# Patient Record
Sex: Female | Born: 1950 | Race: White | Hispanic: No | Marital: Married | Smoking: Never smoker
Health system: Southern US, Community
[De-identification: ages and names within clinical notes are randomized; demographics above are authoritative.]

## PROBLEM LIST (undated history)

## (undated) DIAGNOSIS — G43909 Migraine, unspecified, not intractable, without status migrainosus: Secondary | ICD-10-CM

## (undated) DIAGNOSIS — M1990 Unspecified osteoarthritis, unspecified site: Secondary | ICD-10-CM

## (undated) DIAGNOSIS — Z8619 Personal history of other infectious and parasitic diseases: Secondary | ICD-10-CM

## (undated) DIAGNOSIS — M810 Age-related osteoporosis without current pathological fracture: Secondary | ICD-10-CM

## (undated) DIAGNOSIS — U071 COVID-19: Secondary | ICD-10-CM

## (undated) DIAGNOSIS — H9190 Unspecified hearing loss, unspecified ear: Secondary | ICD-10-CM

## (undated) DIAGNOSIS — E559 Vitamin D deficiency, unspecified: Secondary | ICD-10-CM

## (undated) DIAGNOSIS — R42 Dizziness and giddiness: Secondary | ICD-10-CM

## (undated) DIAGNOSIS — N390 Urinary tract infection, site not specified: Secondary | ICD-10-CM

## (undated) HISTORY — DX: Unspecified hearing loss, unspecified ear: H91.90

## (undated) HISTORY — DX: Unspecified osteoarthritis, unspecified site: M19.90

## (undated) HISTORY — DX: Personal history of other infectious and parasitic diseases: Z86.19

## (undated) HISTORY — DX: COVID-19: U07.1

## (undated) HISTORY — DX: Migraine, unspecified, not intractable, without status migrainosus: G43.909

## (undated) HISTORY — PX: OTHER SURGICAL HISTORY: SHX169

## (undated) HISTORY — DX: Vitamin D deficiency, unspecified: E55.9

## (undated) HISTORY — DX: Age-related osteoporosis without current pathological fracture: M81.0

## (undated) HISTORY — DX: Urinary tract infection, site not specified: N39.0

## (undated) HISTORY — PX: BREAST BIOPSY: SHX20

---
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
## 2002-06-28 DIAGNOSIS — D170 Benign lipomatous neoplasm of skin and subcutaneous tissue of head, face and neck: Secondary | ICD-10-CM | POA: Insufficient documentation

## 2003-04-10 DIAGNOSIS — M778 Other enthesopathies, not elsewhere classified: Secondary | ICD-10-CM | POA: Insufficient documentation

## 2003-07-03 DIAGNOSIS — M159 Polyosteoarthritis, unspecified: Secondary | ICD-10-CM | POA: Insufficient documentation

## 2003-09-12 DIAGNOSIS — M6740 Ganglion, unspecified site: Secondary | ICD-10-CM | POA: Insufficient documentation

## 2004-11-18 DIAGNOSIS — R6889 Other general symptoms and signs: Secondary | ICD-10-CM | POA: Insufficient documentation

## 2006-09-21 DIAGNOSIS — R55 Syncope and collapse: Secondary | ICD-10-CM | POA: Insufficient documentation

## 2006-12-25 DIAGNOSIS — H8309 Labyrinthitis, unspecified ear: Secondary | ICD-10-CM | POA: Insufficient documentation

## 2017-04-09 DIAGNOSIS — H43393 Other vitreous opacities, bilateral: Secondary | ICD-10-CM | POA: Diagnosis not present

## 2017-04-09 DIAGNOSIS — H04123 Dry eye syndrome of bilateral lacrimal glands: Secondary | ICD-10-CM | POA: Diagnosis not present

## 2017-04-09 DIAGNOSIS — H25091 Other age-related incipient cataract, right eye: Secondary | ICD-10-CM | POA: Diagnosis not present

## 2017-04-09 DIAGNOSIS — H2512 Age-related nuclear cataract, left eye: Secondary | ICD-10-CM | POA: Diagnosis not present

## 2017-06-01 DIAGNOSIS — J069 Acute upper respiratory infection, unspecified: Secondary | ICD-10-CM | POA: Diagnosis not present

## 2017-06-10 DIAGNOSIS — M2022 Hallux rigidus, left foot: Secondary | ICD-10-CM | POA: Diagnosis not present

## 2017-06-10 DIAGNOSIS — M150 Primary generalized (osteo)arthritis: Secondary | ICD-10-CM | POA: Diagnosis not present

## 2017-06-10 DIAGNOSIS — M659 Synovitis and tenosynovitis, unspecified: Secondary | ICD-10-CM | POA: Diagnosis not present

## 2017-10-21 ENCOUNTER — Ambulatory Visit (INDEPENDENT_AMBULATORY_CARE_PROVIDER_SITE_OTHER): Payer: MEDICARE | Admitting: Internal Medicine

## 2017-10-21 ENCOUNTER — Encounter: Payer: Self-pay | Admitting: Internal Medicine

## 2017-10-21 ENCOUNTER — Encounter (INDEPENDENT_AMBULATORY_CARE_PROVIDER_SITE_OTHER): Payer: Self-pay

## 2017-10-21 VITALS — BP 132/78 | HR 68 | Temp 98.1°F | Ht 60.75 in | Wt 130.1 lb

## 2017-10-21 DIAGNOSIS — M1990 Unspecified osteoarthritis, unspecified site: Secondary | ICD-10-CM | POA: Diagnosis not present

## 2017-10-21 DIAGNOSIS — Z0000 Encounter for general adult medical examination without abnormal findings: Secondary | ICD-10-CM | POA: Diagnosis not present

## 2017-10-21 DIAGNOSIS — L989 Disorder of the skin and subcutaneous tissue, unspecified: Secondary | ICD-10-CM

## 2017-10-21 DIAGNOSIS — Z1159 Encounter for screening for other viral diseases: Secondary | ICD-10-CM

## 2017-10-21 DIAGNOSIS — H9192 Unspecified hearing loss, left ear: Secondary | ICD-10-CM

## 2017-10-21 DIAGNOSIS — Z0184 Encounter for antibody response examination: Secondary | ICD-10-CM | POA: Diagnosis not present

## 2017-10-21 DIAGNOSIS — Z23 Encounter for immunization: Secondary | ICD-10-CM | POA: Diagnosis not present

## 2017-10-21 DIAGNOSIS — Z13220 Encounter for screening for lipoid disorders: Secondary | ICD-10-CM

## 2017-10-21 DIAGNOSIS — H905 Unspecified sensorineural hearing loss: Secondary | ICD-10-CM | POA: Insufficient documentation

## 2017-10-21 DIAGNOSIS — E559 Vitamin D deficiency, unspecified: Secondary | ICD-10-CM

## 2017-10-21 DIAGNOSIS — Z1389 Encounter for screening for other disorder: Secondary | ICD-10-CM

## 2017-10-21 DIAGNOSIS — Z8739 Personal history of other diseases of the musculoskeletal system and connective tissue: Secondary | ICD-10-CM

## 2017-10-21 DIAGNOSIS — Z1329 Encounter for screening for other suspected endocrine disorder: Secondary | ICD-10-CM

## 2017-10-21 DIAGNOSIS — Z1283 Encounter for screening for malignant neoplasm of skin: Secondary | ICD-10-CM

## 2017-10-21 DIAGNOSIS — Z8669 Personal history of other diseases of the nervous system and sense organs: Secondary | ICD-10-CM | POA: Diagnosis not present

## 2017-10-21 DIAGNOSIS — L821 Other seborrheic keratosis: Secondary | ICD-10-CM | POA: Insufficient documentation

## 2017-10-21 NOTE — Patient Instructions (Addendum)
Magnesium 400 mg can help with migraine  Strontrium 500 mg daily  Prolia injection Rx every 6 months  Referred to Dr. Kellie Moor dermatology [facility]   Seborrheic Keratosis Seborrheic keratosis is a common, noncancerous (benign) skin growth. This condition causes waxy, rough, tan, brown, or black spots to appear on the skin. These skin growths can be flat or raised. What are the causes? The cause of this condition is not known. What increases the risk? This condition is more likely to develop in:  People who have a family history of seborrheic keratosis.  People who are 67 or older.  People who are pregnant.  People who have had estrogen replacement therapy.  What are the signs or symptoms? This condition often occurs on the face, chest, shoulders, back, or other areas. These growths:  Are usually painless, but may become irritated and itchy.  Can be yellow, brown, black, or other colors.  Are slightly raised or have a flat surface.  Are sometimes rough or wart-like in texture.  Are often waxy on the surface.  Are round or oval-shaped.  Sometimes look like they are "stuck on."  Often occur in groups, but may occur as a single growth.  How is this diagnosed? This condition is diagnosed with a medical history and physical exam. A sample of the growth may be tested (skin biopsy). You may need to see a skin specialist (dermatologist). How is this treated? Treatment is not usually needed for this condition, unless the growths are irritated or are often bleeding. You may also choose to have the growths removed if you do not like their appearance. Most commonly, these growths are treated with a procedure in which liquid nitrogen is applied to "freeze" off the growth (cryosurgery). They may also be burned off with electricity or cut off. Follow these instructions at home:  Watch your growth for any changes.  Keep all follow-up visits as told by your health care provider. This is  important.  Do not scratch or pick at the growth or growths. This can cause them to become irritated or infected. Contact a health care provider if:  You suddenly have many new growths.  Your growth bleeds, itches, or hurts.  Your growth suddenly becomes larger or changes color. This information is not intended to replace advice given to you by your health care provider. Make sure you discuss any questions you have with your health care provider. Document Released: 01/25/2010 Document Revised: 05/31/2015 Document Reviewed: 05/10/2014 Elsevier Interactive Patient Education  2018 [employer].    Osteoporosis Osteoporosis is the thinning and loss of density in the bones. Osteoporosis makes the bones more brittle, fragile, and likely to break (fracture). Over time, osteoporosis can cause the bones to become so weak that they fracture after a simple fall. The bones most likely to fracture are the bones in the hip, wrist, and spine. What are the causes? The exact cause is not known. What increases the risk? Anyone can develop osteoporosis. You may be at greater risk if you have a family history of the condition or have poor nutrition. You may also have a higher risk if you are:  Female.  61 years old or older.  A smoker.  Not physically active.  White or Asian.  Slender.  What are the signs or symptoms? A fracture might be the first sign of the disease, especially if it results from a fall or injury that would not usually cause a bone to break. Other signs and  symptoms include:  Low back and neck pain.  Stooped posture.  Height loss.  How is this diagnosed? To make a diagnosis, your health care provider may:  Take a medical history.  Perform a physical exam.  Order tests, such as: ? A bone mineral density test. ? A dual-energy X-ray absorptiometry test.  How is this treated? The goal of osteoporosis treatment is to strengthen your bones to reduce your risk of a  fracture. Treatment may involve:  Making lifestyle changes, such as: ? Eating a diet rich in calcium. ? Doing weight-bearing and muscle-strengthening exercises. ? Stopping tobacco use. ? Limiting alcohol intake.  Taking medicine to slow the process of bone loss or to increase bone density.  Monitoring your levels of calcium and vitamin D.  Follow these instructions at home:  Include calcium and vitamin D in your diet. Calcium is important for bone health, and vitamin D helps the body absorb calcium.  Perform weight-bearing and muscle-strengthening exercises as directed by your health care provider.  Do not use any tobacco products, including cigarettes, chewing tobacco, and electronic cigarettes. If you need help quitting, ask your health care provider.  Limit your alcohol intake.  Take medicines only as directed by your health care provider.  Keep all follow-up visits as directed by your health care provider. This is important.  Take precautions at home to lower your risk of falling, such as: ? Keeping rooms well lit and clutter free. ? Installing safety rails on stairs. ? Using rubber mats in the bathroom and other areas that are often wet or slippery. Get help right away if: You fall or injure yourself. This information is not intended to replace advice given to you by your health care provider. Make sure you discuss any questions you have with your health care provider. Document Released: 10/02/2004 Document Revised: 05/28/2015 Document Reviewed: 06/02/2013 Elsevier Interactive Patient Education  2018 [employer].    shingrix vaccine/Recombinant Zoster (Shingles) Vaccine, RZV: What You Need to Know 1. Why get vaccinated? Shingles (also called herpes zoster, or just zoster) is a painful skin rash, often with blisters. Shingles is caused by the varicella zoster virus, the same virus that causes chickenpox. After you have chickenpox, the virus stays in your body and can  cause shingles later in life. You can't catch shingles from another person. However, a person who has never had chickenpox (or chickenpox vaccine) could get chickenpox from someone with shingles. A shingles rash usually appears on one side of the face or body and heals within 2 to 4 weeks. Its main symptom is pain, which can be severe. Other symptoms can include fever, headache, chills and upset stomach. Very rarely, a shingles infection can lead to pneumonia, hearing problems, blindness, brain inflammation (encephalitis), or death. For about 1 person in 5, severe pain can continue even long after the rash has cleared up. This long-lasting pain is called post-herpetic neuralgia (PHN). Shingles is far more common in people 61 years of age and older than in younger people, and the risk increases with age. It is also more common in people whose immune system is weakened because of a disease such as cancer, or by drugs such as steroids or chemotherapy. At least 1 million people a year in the [place] get shingles. 2. Shingles vaccine (recombinant) Recombinant shingles vaccine was approved by FDA in 2017 for the prevention of shingles. In clinical trials, it was more than 90% effective in preventing shingles. It can also reduce the likelihood  of PHN. Two doses, 2 to 6 months apart, are recommended for adults 28 and older. This vaccine is also recommended for people who have already gotten the live shingles vaccine (Zostavax). There is no live virus in this vaccine. 3. Some people should not get this vaccine Tell your vaccine provider if you:  Have any severe, life-threatening allergies. A person who has ever had a life-threatening allergic reaction after a dose of recombinant shingles vaccine, or has a severe allergy to any component of this vaccine, may be advised not to be vaccinated. Ask your health care provider if you want information about vaccine components.  Are pregnant or breastfeeding.  There is not much information about use of recombinant shingles vaccine in pregnant or nursing women. Your healthcare provider might recommend delaying vaccination.  Are not feeling well. If you have a mild illness, such as a cold, you can probably get the vaccine today. If you are moderately or severely ill, you should probably wait until you recover. Your doctor can advise you.  4. Risks of a vaccine reaction With any medicine, including vaccines, there is a chance of reactions. After recombinant shingles vaccination, a person might experience:  Pain, redness, soreness, or swelling at the site of the injection  Headache, muscle aches, fever, shivering, fatigue  In clinical trials, most people got a sore arm with mild or moderate pain after vaccination, and some also had redness and swelling where they got the shot. Some people felt tired, had muscle pain, a headache, shivering, fever, stomach pain, or nausea. About 1 out of 6 people who got recombinant zoster vaccine experienced side effects that prevented them from doing regular activities. Symptoms went away on their own in about 2 to 3 days. Side effects were more common in younger people. You should still get the second dose of recombinant zoster vaccine even if you had one of these reactions after the first dose. Other things that could happen after this vaccine:  People sometimes faint after medical procedures, including vaccination. Sitting or lying down for about 15 minutes can help prevent fainting and injuries caused by a fall. Tell your provider if you feel dizzy or have vision changes or ringing in the ears.  Some people get shoulder pain that can be more severe and longer-lasting than routine soreness that can follow injections. This happens very rarely.  Any medication can cause a severe allergic reaction. Such reactions to a vaccine are estimated at about 1 in a million doses, and would happen within a few minutes to a few hours  after the vaccination. As with any medicine, there is a very remote chance of a vaccine causing a serious injury or death. The safety of vaccines is always being monitored. For more information, visit: http://www.aguilar.org/ 5. What if there is a serious problem? What should I look for?  Look for anything that concerns you, such as signs of a severe allergic reaction, very high fever, or unusual behavior. Signs of a severe allergic reaction can include hives, swelling of the face and throat, difficulty breathing, a fast heartbeat, dizziness, and weakness. These would usually start a few minutes to a few hours after the vaccination. What should I do?  If you think it is a severe allergic reaction or other emergency that can't wait, call 9-1-1 and get to the nearest hospital. Otherwise, call your health care provider. Afterward, the reaction should be reported to the Vaccine Adverse Event Reporting System (VAERS). Your doctor should file this report,  or you can do it yourself through the VAERS web site atwww.vaers.https://www.bray.com/ by calling [phone]. VAERS does not give medical advice. 6. How can I learn more?  Ask your healthcare provider. He or she can give you the vaccine package insert or suggest other sources of information.  Call your local or state health department.  Contact the Centers for Disease Control and Prevention (CDC): ? Call 769-414-6370 (1-800-CDC-INFO) or ? Visit the CDC's website at http://hunter.com/ CDC Vaccine Information Statement (VIS) Recombinant Zoster Vaccine (02/18/2016) This information is not intended to replace advice given to you by your health care provider. Make sure you discuss any questions you have with your health care provider. Document Released: 03/04/2016 Document Revised: 03/04/2016 Document Reviewed: 03/04/2016 Elsevier Interactive Patient Education  2018 [employer].   Migraine Headache A migraine headache is an intense, throbbing  pain on one side or both sides of the head. Migraines may also cause other symptoms, such as nausea, vomiting, and sensitivity to light and noise. What are the causes? Doing or taking certain things may also trigger migraines, such as:  Alcohol.  Smoking.  Medicines, such as: ? Medicine used to treat chest pain (nitroglycerine). ? Birth control pills. ? Estrogen pills. ? Certain blood pressure medicines.  Aged cheeses, chocolate, or caffeine.  Foods or drinks that contain nitrates, glutamate, aspartame, or tyramine.  Physical activity.  Other things that may trigger a migraine include:  Menstruation.  Pregnancy.  Hunger.  Stress, lack of sleep, too much sleep, or fatigue.  Weather changes.  What increases the risk? The following factors may make you more likely to experience migraine headaches:  Age. Risk increases with age.  Family history of migraine headaches.  Being Caucasian.  Depression and anxiety.  Obesity.  Being a woman.  Having a hole in the heart (patent foramen ovale) or other heart problems.  What are the signs or symptoms? The main symptom of this condition is pulsating or throbbing pain. Pain may:  Happen in any area of the head, such as on one side or both sides.  Interfere with daily activities.  Get worse with physical activity.  Get worse with exposure to bright lights or loud noises.  Other symptoms may include:  Nausea.  Vomiting.  Dizziness.  General sensitivity to bright lights, loud noises, or smells.  Before you get a migraine, you may get warning signs that a migraine is developing (aura). An aura may include:  Seeing flashing lights or having blind spots.  Seeing bright spots, halos, or zigzag lines.  Having tunnel vision or blurred vision.  Having numbness or a tingling feeling.  Having trouble talking.  Having muscle weakness.  How is this diagnosed? A migraine headache can be diagnosed based  on:  Your symptoms.  A physical exam.  Tests, such as CT scan or MRI of the head. These imaging tests can help rule out other causes of headaches.  Taking fluid from the spine (lumbar puncture) and analyzing it (cerebrospinal fluid analysis, or CSF analysis).  How is this treated? A migraine headache is usually treated with medicines that:  Relieve pain.  Relieve nausea.  Prevent migraines from coming back.  Treatment may also include:  Acupuncture.  Lifestyle changes like avoiding foods that trigger migraines.  Follow these instructions at home: Medicines  Take over-the-counter and prescription medicines only as told by your health care provider.  Do not drive or use heavy machinery while taking prescription pain medicine.  To prevent or treat constipation while you  are taking prescription pain medicine, your health care provider may recommend that you: ? Drink enough fluid to keep your urine clear or pale yellow. ? Take over-the-counter or prescription medicines. ? Eat foods that are high in fiber, such as fresh fruits and vegetables, whole grains, and beans. ? Limit foods that are high in fat and processed sugars, such as fried and sweet foods. Lifestyle  Avoid alcohol use.  Do not use any products that contain nicotine or tobacco, such as cigarettes and e-cigarettes. If you need help quitting, ask your health care provider.  Get at least 8 hours of sleep every night.  Limit your stress. General instructions   Keep a journal to find out what may trigger your migraine headaches. For example, write down: ? What you eat and drink. ? How much sleep you get. ? Any change to your diet or medicines.  If you have a migraine: ? Avoid things that make your symptoms worse, such as bright lights. ? It may help to lie down in a dark, quiet room. ? Do not drive or use heavy machinery. ? Ask your health care provider what activities are safe for you while you are  experiencing symptoms.  Keep all follow-up visits as told by your health care provider. This is important. Contact a health care provider if:  You develop symptoms that are different or more severe than your usual migraine symptoms. Get help right away if:  Your migraine becomes severe.  You have a fever.  You have a stiff neck.  You have vision loss.  Your muscles feel weak or like you cannot control them.  You start to lose your balance often.  You develop trouble walking.  You faint. This information is not intended to replace advice given to you by your health care provider. Make sure you discuss any questions you have with your health care provider. Document Released: 12/23/2004 Document Revised: 07/13/2015 Document Reviewed: 06/11/2015 Elsevier Interactive Patient Education  2017 [employer].

## 2017-10-21 NOTE — Progress Notes (Addendum)
Chief Complaint  Patient presents with  . Establish Care  . Mass    R eyelid   . Referral    dermatology for wart   New patient moved from  1. H/o migraines tries Excedrine migraine-normally sees swigly lines/distorted vision unable to talk, left arm recently numb; senstive light and sound. FH daughter, uncles and brother with migraines had MRI/CT 09/27/16 normal per pt for another reasons.  2. H/o arthritis esp hands uses prn Voltaren gel  3. C/o SK left scalp and legs and need for tbse  4. Vit D def  5. Right eye red lesion ? Etiology bruise vs dermatitis  6. H/o osteoporosis Boniva caused jaw pain so she stopped will get copy DEXA from [place] 7. Left hearing loss since 09/27/16 CT/MRIs negative per pt and she saw ENTwears left hearing add    Review of Systems  HENT: Positive for hearing loss.        Left ear chronic hearing loss since 09/2016  Eyes: Negative for blurred vision.  Respiratory: Negative for shortness of breath.   Cardiovascular: Negative for chest pain.  Gastrointestinal: Negative for abdominal pain.  Genitourinary: Negative for dysuria.  Musculoskeletal: Positive for joint pain.  Skin:       Right eyelid lesion  Left scalp lesion   Neurological: Negative for headaches.  Psychiatric/Behavioral: Negative for depression.   Past Medical History:  Diagnosis Date  . Arthritis    hands  . Hearing loss    left ear 09/27/16 MR, CT negative etiology she had rash neck 2 days before hearing loss left ear so ? virus  . History of chicken pox   . Migraine    tries Excedrine migraine-normally sees swigly lines/distorted vision unable to talk, left arm recently numb; senstive light and sound  . Osteoporosis   . UTI (urinary tract infection)   . Vitamin D deficiency    Past Surgical History:  Procedure Laterality Date  . CESAREAN SECTION     1986  . hospitalization     2006 untx'ed UTI  . TONSILLECTOMY  1966   Family History  Problem Relation Age of Onset  . Lung  cancer Mother   . Early death Mother   . Cancer Mother        lung cancer smoker   . Heart attack Father   . Stroke Father   . Heart disease Father   . Arthritis Brother        rheumatoid  . Migraines Brother   . Migraines Daughter   . Diabetes Maternal Grandmother        died comp. DM 2   . Early death Maternal Grandmother   . Hypertension Maternal Grandmother   . Early death Paternal Grandfather   . Hearing loss Paternal Grandfather    Social History   Socioeconomic History  . Marital status: Married    Spouse name: Not on file  . Number of children: Not on file  . Years of education: Not on file  . Highest education level: Not on file  Occupational History  . Not on file  Social Needs  . Financial resource strain: Not on file  . Food insecurity:    Worry: Not on file    Inability: Not on file  . Transportation needs:    Medical: Not on file    Non-medical: Not on file  Tobacco Use  . Smoking status: Never Smoker  . Smokeless tobacco: Never Used  Substance and Sexual Activity  .  Alcohol use: Never    Frequency: Never  . Drug use: Never  . Sexual activity: Yes  Lifestyle  . Physical activity:    Days per week: Not on file    Minutes per session: Not on file  . Stress: Not on file  Relationships  . Social connections:    Talks on phone: Not on file    Gets together: Not on file    Attends religious service: Not on file    Active member of club or organization: Not on file    Attends meetings of clubs or organizations: Not on file    Relationship status: Not on file  . Intimate partner violence:    Fear of current or ex partner: Not on file    Emotionally abused: Not on file    Physically abused: Not on file    Forced sexual activity: Not on file  Other Topics Concern  . Not on file  Social History Narrative   Married [name]    1 kid    And 1 stepkid and 1 adopted kid    Retired Scientist, water quality    Owns guns, wears seat belt, safe in  relationship    Current Meds  Medication Sig  . diclofenac sodium (VOLTAREN) 1 % GEL Apply topically every 14 (fourteen) days.   No Known Allergies No results found for this or any previous visit (from the past 2160 hour(s)). Objective  Body mass index is 24.79 kg/m. Wt Readings from Last 3 Encounters:  10/21/17 130 lb 2 oz (59 kg)   Temp Readings from Last 3 Encounters:  10/21/17 98.1 F (36.7 C) (Oral)   BP Readings from Last 3 Encounters:  10/21/17 132/78   Pulse Readings from Last 3 Encounters:  10/21/17 68    Physical Exam  Constitutional: She is oriented to person, place, and time. Vital signs are normal. She appears well-developed and well-nourished. She is cooperative.  HENT:  Head: Normocephalic and atraumatic.  Mouth/Throat: Oropharynx is clear and moist and mucous membranes are normal.  Eyes: Pupils are equal, round, and reactive to light. Conjunctivae are normal.  Cardiovascular: Normal rate, regular rhythm and normal heart sounds.  Pulmonary/Chest: Effort normal and breath sounds normal.  Neurological: She is alert and oriented to person, place, and time. Gait normal.  Skin: Skin is warm and dry.     SK left scalp  Sks legs   Psychiatric: She has a normal mood and affect. Her speech is normal and behavior is normal. Judgment and thought content normal. Cognition and memory are normal.  Nursing note and vitals reviewed.   Assessment   1. H/o migraines  -see pmh details 2. H/o arthritis  3. C/o SK left scalp and legs and need for tbse  4. Vit D def  5. Right eye red lesion ? Etiology bruise vs dermatitis  6. H/o osteoporosis Boniva caused jaw pain 7. Left hearing loss since 09/27/16 CT/MRIs negative per pt and she saw ENTwears left hearing aid  8. HM Plan   1. Monitor  2. Prn voltaren gel  3. Refer to dermatolgy  4. Check labs  5. Can try otc hc cream to see if helps vs monitor  Call back if worsening  6. Get copy prev DEXA  7. ntd  8.  Flu  shot given today Tdap 2017 per pt   Consider shingrix and prevnar or pna 23 if has not had   Never smoker  Get records pap (h/o normal per  pt), mammo due 03/2018 per pt, colonoscopy 2011, mammogram, DEXA h/o osteoporosis (see above was given Boniva in the past noted 12/08/16), vaccines (Tdap, pna vaccines, zostervax) hep C former PCP Dr. Shellee Milo in [place] [phone]  Will do cologuard faxed today declines further colonoscopies had in ? 2011 no h/o polyps and no FH colon cancer  sch fasting labs Refer dermatology today   Reviewed old PCP records and labs Dr. 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 Manuella Ghazi 03/19/18 Neurology  Provider: Dr. Olivia Mackie McLean-Scocuzza-Internal Medicine

## 2017-10-23 ENCOUNTER — Encounter: Payer: Self-pay | Admitting: Internal Medicine

## 2017-11-12 ENCOUNTER — Other Ambulatory Visit: Payer: Self-pay | Admitting: Internal Medicine

## 2017-11-12 ENCOUNTER — Telehealth: Payer: Self-pay | Admitting: Internal Medicine

## 2017-11-12 ENCOUNTER — Other Ambulatory Visit (INDEPENDENT_AMBULATORY_CARE_PROVIDER_SITE_OTHER): Payer: MEDICARE

## 2017-11-12 DIAGNOSIS — Z13220 Encounter for screening for lipoid disorders: Secondary | ICD-10-CM | POA: Diagnosis not present

## 2017-11-12 DIAGNOSIS — Z1159 Encounter for screening for other viral diseases: Secondary | ICD-10-CM | POA: Diagnosis not present

## 2017-11-12 DIAGNOSIS — Z1389 Encounter for screening for other disorder: Secondary | ICD-10-CM

## 2017-11-12 DIAGNOSIS — E559 Vitamin D deficiency, unspecified: Secondary | ICD-10-CM | POA: Diagnosis not present

## 2017-11-12 DIAGNOSIS — Z0000 Encounter for general adult medical examination without abnormal findings: Secondary | ICD-10-CM | POA: Diagnosis not present

## 2017-11-12 DIAGNOSIS — H6991 Unspecified Eustachian tube disorder, right ear: Secondary | ICD-10-CM

## 2017-11-12 DIAGNOSIS — Z1329 Encounter for screening for other suspected endocrine disorder: Secondary | ICD-10-CM

## 2017-11-12 DIAGNOSIS — Z0184 Encounter for antibody response examination: Secondary | ICD-10-CM | POA: Diagnosis not present

## 2017-11-12 LAB — CBC WITH DIFFERENTIAL/PLATELET
BASOS PCT: 1.4 % (ref 0.0–3.0)
Basophils Absolute: 0.1 10*3/uL (ref 0.0–0.1)
EOS ABS: 0.2 10*3/uL (ref 0.0–0.7)
Eosinophils Relative: 3.6 % (ref 0.0–5.0)
HEMATOCRIT: 41.7 % (ref 36.0–46.0)
Hemoglobin: 14.1 g/dL (ref 12.0–15.0)
Lymphocytes Relative: 32.8 % (ref 12.0–46.0)
Lymphs Abs: 1.5 10*3/uL (ref 0.7–4.0)
MCHC: 33.9 g/dL (ref 30.0–36.0)
MCV: 90.8 fl (ref 78.0–100.0)
MONO ABS: 0.3 10*3/uL (ref 0.1–1.0)
Monocytes Relative: 6.7 % (ref 3.0–12.0)
Neutro Abs: 2.6 10*3/uL (ref 1.4–7.7)
Neutrophils Relative %: 55.5 % (ref 43.0–77.0)
PLATELETS: 279 10*3/uL (ref 150.0–400.0)
RBC: 4.59 Mil/uL (ref 3.87–5.11)
RDW: 13 % (ref 11.5–15.5)
WBC: 4.6 10*3/uL (ref 4.0–10.5)

## 2017-11-12 LAB — LIPID PANEL
CHOLESTEROL: 228 mg/dL — AB (ref 0–200)
HDL: 62.6 mg/dL (ref >39.00)
LDL CALC: 143 mg/dL — AB (ref 0–99)
NonHDL: 165.49
TRIGLYCERIDES: 110 mg/dL (ref 0.0–149.0)
Total CHOL/HDL Ratio: 4
VLDL: 22 mg/dL (ref 0.0–40.0)

## 2017-11-12 LAB — COMPREHENSIVE METABOLIC PANEL
ALT: 15 U/L (ref 0–35)
AST: 22 U/L (ref 0–37)
Albumin: 4.2 g/dL (ref 3.5–5.2)
Alkaline Phosphatase: 50 U/L (ref 39–117)
BUN: 16 mg/dL (ref 6–23)
CALCIUM: 8.9 mg/dL (ref 8.4–10.5)
CHLORIDE: 104 meq/L (ref 96–112)
CO2: 29 meq/L (ref 19–32)
CREATININE: 0.73 mg/dL (ref 0.40–1.20)
GFR: 84.57 mL/min (ref >60.00)
Glucose, Bld: 103 mg/dL — ABNORMAL HIGH (ref 70–99)
POTASSIUM: 3.7 meq/L (ref 3.5–5.1)
SODIUM: 139 meq/L (ref 135–145)
Total Bilirubin: 0.7 mg/dL (ref 0.2–1.2)
Total Protein: 6.9 g/dL (ref 6.0–8.3)

## 2017-11-12 LAB — VITAMIN D 25 HYDROXY (VIT D DEFICIENCY, FRACTURES): VITD: 27.65 ng/mL — ABNORMAL LOW (ref 30.00–100.00)

## 2017-11-12 LAB — TSH: TSH: 3.03 u[IU]/mL (ref 0.35–4.50)

## 2017-11-12 NOTE — Telephone Encounter (Signed)
She can try otc flonase and zyrtec or claritin or allegra plus D x 3 days and then regular zyrtec or claritin or allegra w/o the D  Sent to [facility] she can call daily for cancellations if the do not have a soon enough appt  [employer]

## 2017-11-12 NOTE — Addendum Note (Signed)
Addended by: Leeanne Rio on: 11/12/2017 08:20 AM   Modules accepted: Orders

## 2017-11-12 NOTE — Telephone Encounter (Signed)
Pt would like to get a referral to see the ENT due to her right ear popping since she got back from [place] and it was bothering her when she was there as well. Pt feels like its still not right. Please advise? Thank you!  Call pt @ [phone].

## 2017-11-13 LAB — MICROSCOPIC EXAMINATION
Bacteria, UA: NONE SEEN
Casts: NONE SEEN /lpf

## 2017-11-13 LAB — HEPATITIS C ANTIBODY
Hepatitis C Ab: NONREACTIVE
SIGNAL TO CUT-OFF: 0.02 (ref <1.00)

## 2017-11-13 LAB — MEASLES/MUMPS/RUBELLA IMMUNITY
Mumps IgG: 36.5 AU/mL
Rubella: 9.83 index

## 2017-11-13 LAB — URINALYSIS, ROUTINE W REFLEX MICROSCOPIC
Bilirubin, UA: NEGATIVE
Glucose, UA: NEGATIVE
Ketones, UA: NEGATIVE
NITRITE UA: NEGATIVE
Protein, UA: NEGATIVE
RBC, UA: NEGATIVE
Specific Gravity, UA: 1.021 (ref 1.005–1.030)
Urobilinogen, Ur: 0.2 mg/dL (ref 0.2–1.0)
pH, UA: 5.5 (ref 5.0–7.5)

## 2017-11-16 DIAGNOSIS — Z1211 Encounter for screening for malignant neoplasm of colon: Secondary | ICD-10-CM | POA: Diagnosis not present

## 2017-11-16 DIAGNOSIS — Z1212 Encounter for screening for malignant neoplasm of rectum: Secondary | ICD-10-CM | POA: Diagnosis not present

## 2017-11-16 LAB — COLOGUARD: Cologuard: NEGATIVE

## 2017-11-17 NOTE — Telephone Encounter (Signed)
Patient advised of below and verbalized understanding.  

## 2017-11-23 ENCOUNTER — Telehealth: Payer: Self-pay | Admitting: Internal Medicine

## 2017-11-23 ENCOUNTER — Encounter: Payer: Self-pay | Admitting: Internal Medicine

## 2017-11-23 NOTE — Telephone Encounter (Signed)
Call pt cologuard negative 11/16/17 will repeat in 3 years   [place]

## 2017-11-24 ENCOUNTER — Telehealth: Payer: Self-pay | Admitting: Internal Medicine

## 2017-11-24 NOTE — Telephone Encounter (Signed)
Pt givencologuard results 11/16/17 per notes of Dr Mclean-Scocuzza on 11/23/17. Pt verbalized understanding.

## 2017-11-24 NOTE — Telephone Encounter (Signed)
Left message for patient to return call back. [facility] may give results and  information.

## 2017-11-27 DIAGNOSIS — H905 Unspecified sensorineural hearing loss: Secondary | ICD-10-CM | POA: Diagnosis not present

## 2017-11-27 DIAGNOSIS — H6123 Impacted cerumen, bilateral: Secondary | ICD-10-CM | POA: Diagnosis not present

## 2018-02-02 DIAGNOSIS — C44519 Basal cell carcinoma of skin of other part of trunk: Secondary | ICD-10-CM | POA: Diagnosis not present

## 2018-02-02 DIAGNOSIS — L821 Other seborrheic keratosis: Secondary | ICD-10-CM | POA: Diagnosis not present

## 2018-02-02 DIAGNOSIS — D485 Neoplasm of uncertain behavior of skin: Secondary | ICD-10-CM | POA: Diagnosis not present

## 2018-02-02 DIAGNOSIS — I781 Nevus, non-neoplastic: Secondary | ICD-10-CM | POA: Diagnosis not present

## 2018-02-11 DIAGNOSIS — C44519 Basal cell carcinoma of skin of other part of trunk: Secondary | ICD-10-CM | POA: Diagnosis not present

## 2018-02-22 DIAGNOSIS — M659 Synovitis and tenosynovitis, unspecified: Secondary | ICD-10-CM | POA: Diagnosis not present

## 2018-02-22 DIAGNOSIS — M19042 Primary osteoarthritis, left hand: Secondary | ICD-10-CM | POA: Diagnosis not present

## 2018-02-22 DIAGNOSIS — M19041 Primary osteoarthritis, right hand: Secondary | ICD-10-CM | POA: Diagnosis not present

## 2018-03-09 ENCOUNTER — Ambulatory Visit: Payer: PRIVATE HEALTH INSURANCE | Admitting: Internal Medicine

## 2018-03-13 ENCOUNTER — Other Ambulatory Visit: Payer: Self-pay

## 2018-03-13 ENCOUNTER — Other Ambulatory Visit
Admission: RE | Admit: 2018-03-13 | Discharge: 2018-03-13 | Disposition: A | Discharge status: Home / Self Care | Payer: MEDICARE | Source: Ambulatory Visit | Attending: Ophthalmology | Admitting: Ophthalmology

## 2018-03-13 DIAGNOSIS — H47011 Ischemic optic neuropathy, right eye: Secondary | ICD-10-CM | POA: Insufficient documentation

## 2018-03-13 DIAGNOSIS — R51 Headache: Secondary | ICD-10-CM | POA: Insufficient documentation

## 2018-03-13 LAB — CBC WITH DIFFERENTIAL/PLATELET
Abs Immature Granulocytes: 0.01 10*3/uL (ref 0.00–0.07)
Basophils Absolute: 0 10*3/uL (ref 0.0–0.1)
Basophils Relative: 1 %
Eosinophils Absolute: 0.1 10*3/uL (ref 0.0–0.5)
Eosinophils Relative: 2 %
HCT: 43.7 % (ref 36.0–46.0)
HEMOGLOBIN: 14.4 g/dL (ref 12.0–15.0)
Immature Granulocytes: 0 %
Lymphocytes Relative: 42 %
Lymphs Abs: 2 10*3/uL (ref 0.7–4.0)
MCH: 30.4 pg (ref 26.0–34.0)
MCHC: 33 g/dL (ref 30.0–36.0)
MCV: 92.4 fL (ref 80.0–100.0)
MONOS PCT: 8 %
Monocytes Absolute: 0.4 10*3/uL (ref 0.1–1.0)
NEUTROS PCT: 47 %
Neutro Abs: 2.3 10*3/uL (ref 1.7–7.7)
Platelets: 238 10*3/uL (ref 150–400)
RBC: 4.73 MIL/uL (ref 3.87–5.11)
RDW: 12.6 % (ref 11.5–15.5)
WBC: 4.9 10*3/uL (ref 4.0–10.5)
nRBC: 0 % (ref 0.0–0.2)

## 2018-03-13 LAB — SEDIMENTATION RATE: SED RATE: 5 mm/h (ref 0–30)

## 2018-03-14 LAB — C-REACTIVE PROTEIN: CRP: <0.8 mg/dL (ref <1.0)

## 2018-03-15 DIAGNOSIS — H47011 Ischemic optic neuropathy, right eye: Secondary | ICD-10-CM | POA: Diagnosis not present

## 2018-03-19 DIAGNOSIS — E538 Deficiency of other specified B group vitamins: Secondary | ICD-10-CM | POA: Diagnosis not present

## 2018-03-19 DIAGNOSIS — Z114 Encounter for screening for human immunodeficiency virus [HIV]: Secondary | ICD-10-CM | POA: Diagnosis not present

## 2018-03-19 DIAGNOSIS — Z79899 Other long term (current) drug therapy: Secondary | ICD-10-CM | POA: Diagnosis not present

## 2018-03-19 DIAGNOSIS — H47011 Ischemic optic neuropathy, right eye: Secondary | ICD-10-CM | POA: Diagnosis not present

## 2018-03-19 DIAGNOSIS — E559 Vitamin D deficiency, unspecified: Secondary | ICD-10-CM | POA: Diagnosis not present

## 2018-03-19 DIAGNOSIS — H9042 Sensorineural hearing loss, unilateral, left ear, with unrestricted hearing on the contralateral side: Secondary | ICD-10-CM | POA: Diagnosis not present

## 2018-03-19 DIAGNOSIS — H469 Unspecified optic neuritis: Secondary | ICD-10-CM | POA: Diagnosis not present

## 2018-03-22 ENCOUNTER — Other Ambulatory Visit: Payer: Self-pay | Admitting: Neurology

## 2018-03-22 DIAGNOSIS — H469 Unspecified optic neuritis: Secondary | ICD-10-CM

## 2018-03-25 DIAGNOSIS — H9042 Sensorineural hearing loss, unilateral, left ear, with unrestricted hearing on the contralateral side: Secondary | ICD-10-CM | POA: Insufficient documentation

## 2018-03-26 ENCOUNTER — Other Ambulatory Visit: Payer: Self-pay | Admitting: Neurology

## 2018-03-26 DIAGNOSIS — H469 Unspecified optic neuritis: Secondary | ICD-10-CM

## 2018-03-29 ENCOUNTER — Ambulatory Visit: Payer: MEDICARE

## 2018-03-31 DIAGNOSIS — H469 Unspecified optic neuritis: Secondary | ICD-10-CM | POA: Diagnosis not present

## 2018-04-02 ENCOUNTER — Ambulatory Visit
Admission: RE | Admit: 2018-04-02 | Discharge: 2018-04-02 | Disposition: A | Discharge status: Home / Self Care | Payer: MEDICARE | Source: Ambulatory Visit | Attending: Neurology | Admitting: Neurology

## 2018-04-02 ENCOUNTER — Other Ambulatory Visit: Payer: Self-pay

## 2018-04-02 DIAGNOSIS — E559 Vitamin D deficiency, unspecified: Secondary | ICD-10-CM | POA: Diagnosis not present

## 2018-04-02 DIAGNOSIS — H538 Other visual disturbances: Secondary | ICD-10-CM | POA: Diagnosis not present

## 2018-04-02 DIAGNOSIS — H9042 Sensorineural hearing loss, unilateral, left ear, with unrestricted hearing on the contralateral side: Secondary | ICD-10-CM | POA: Diagnosis not present

## 2018-04-02 DIAGNOSIS — H469 Unspecified optic neuritis: Secondary | ICD-10-CM | POA: Diagnosis not present

## 2018-04-02 MED ORDER — GADOBUTROL 1 MMOL/ML IV SOLN
6.0000 mL | Freq: Once | INTRAVENOUS | Status: AC | PRN
  Administered 2018-04-02: 6 mL via INTRAVENOUS

## 2018-04-27 DIAGNOSIS — R9082 White matter disease, unspecified: Secondary | ICD-10-CM | POA: Insufficient documentation

## 2018-05-17 DIAGNOSIS — H5319 Other subjective visual disturbances: Secondary | ICD-10-CM | POA: Diagnosis not present

## 2018-05-17 DIAGNOSIS — H90A22 Sensorineural hearing loss, unilateral, left ear, with restricted hearing on the contralateral side: Secondary | ICD-10-CM | POA: Diagnosis not present

## 2018-05-17 DIAGNOSIS — H6123 Impacted cerumen, bilateral: Secondary | ICD-10-CM | POA: Diagnosis not present

## 2018-06-10 ENCOUNTER — Telehealth: Payer: Self-pay | Admitting: *Deleted
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
## 2018-06-11 ENCOUNTER — Other Ambulatory Visit: Payer: Self-pay

## 2018-06-11 ENCOUNTER — Encounter: Payer: Self-pay | Admitting: Internal Medicine

## 2018-06-11 ENCOUNTER — Ambulatory Visit (INDEPENDENT_AMBULATORY_CARE_PROVIDER_SITE_OTHER): Payer: MEDICARE

## 2018-06-11 ENCOUNTER — Ambulatory Visit (INDEPENDENT_AMBULATORY_CARE_PROVIDER_SITE_OTHER): Payer: MEDICARE | Admitting: Internal Medicine

## 2018-06-11 DIAGNOSIS — M79604 Pain in right leg: Secondary | ICD-10-CM | POA: Diagnosis not present

## 2018-06-11 DIAGNOSIS — Z1231 Encounter for screening mammogram for malignant neoplasm of breast: Secondary | ICD-10-CM

## 2018-06-11 DIAGNOSIS — T148XXA Other injury of unspecified body region, initial encounter: Secondary | ICD-10-CM | POA: Diagnosis not present

## 2018-06-11 DIAGNOSIS — M79661 Pain in right lower leg: Secondary | ICD-10-CM | POA: Diagnosis not present

## 2018-06-11 NOTE — Telephone Encounter (Signed)
Pt is scheduled °
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
## 2018-06-11 NOTE — Patient Instructions (Addendum)
Try Aspirin 81 mg daily to see if this helps and heat 15 minutes up to 3x per day  If this does not help and Xray negative  We can consider d dimer blood work to work up for a blood clot if negative no blood clot if positive we need to do an ultrasound to work up for a blood clot   We need to schedule mammogram in the future ordered please call to schedule and wear a mask  Be safe and take care   Thrombophlebitis Thrombophlebitis is a condition in which a blood clot forms in a vein. This can happen in your arms or legs, or in the area between your neck and groin (torso). When this condition happens in a vein that is close to the surface of the body (superficial thrombophlebitis), it is usually not serious.However, when the condition happens in a vein that is deep inside the body (deep vein thrombosis, DVT), it can cause serious problems. What are the causes? This condition may be caused by:  Damage to a vein.  Inflammation of the veins.  A condition that causes blood to clot more easily.  Reduced blood flow through the veins. What increases the risk? The following factors may make you more likely to develop this condition:  Having a condition that makes blood thicker or more likely to clot.  Having an infection.  Having major surgery.  Experiencing a traumatic injury or a broken bone.  Having a catheter in a vein (central line).  Having a condition in which valves in the veins do not work properly, causing blood to collect (pool) in the veins (chronic venous insufficiency).  An inactive (sedentary) lifestyle.  Pregnancy or having recently given birth.  Cancer.  Older age, especially being 35 or older.  Obesity.  Smoking.  Taking medicines that contain estrogen, such as birth control pills.  Having varicose veins.  Using drugs that are injected into the veins (intravenous, IV). What are the signs or symptoms? The main symptoms of this condition are:  Swelling and  pain in an arm or leg. If the affected vein is in the leg, you may feel pain while standing or walking.  Warmth or redness in an arm or leg. Other symptoms include:  Low-grade fever.  Muscle aches.  A bulging vein (venous distension). In some cases, there are no symptoms. How is this diagnosed? This condition may be diagnosed based on:  Your symptoms and medical history.  A physical exam.  Tests, such as: ? Blood tests. ? A test that uses sound waves to make images (ultrasound). How is this treated? Treatment depends on how severe the condition is and which area of the body is affected. Treatment may include:  Applying a warm compress or heating pad to affected areas.  Wearing compression stockings to help prevent blood clots and reduce swelling in your legs.  Raising (elevating) the affected arm or leg above the level of your heart.  Medicines, such as: ? Anti-inflammatory medicines, such as ibuprofen, Aspirin. ? Blood thinners (anticoagulants), such as heparin. ? Antibiotic medicine, if you have an infection.  Removing an IV that may be causing the problem. In rare cases, surgery may be needed to:  Remove a damaged section of a vein.  Place a filter in a large vein to catch blood clots before they reach the lungs. Follow these instructions at home: Medicines  Take over-the-counter and prescription medicines only as told by your health care provider.  If you  were prescribed an antibiotic, take it as told by your health care provider. Do not stop using the antibiotic even if you feel better. Managing pain, stiffness, and swelling   If directed, put heat on the affected area as often as told by your health care provider. Use the heat source that your health care provider recommends, such as a moist heat pack or a heating pad. ? Place a towel between your skin and the heat source. ? Leave the heat on for 20-30 minutes. ? Remove the heat if your skin turns bright  red. This is especially important if you are not able to feel pain, heat, or cold. You may have a greater risk of getting burned.  Elevate the affected area above the level of your heart while you are sitting or lying down. Activity  Return to your normal activities as told by your health care provider. Ask your health care provider what activities are safe for you.  Avoid sitting or lying down for long periods. If possible, stand up and walk around regularly. If you are taking blood thinners:  Take your medicine exactly as told, at the same time every day.  Avoid activities that could cause injury or bruising, and follow instructions about how to prevent falls.  Wear a medical alert bracelet or carry a card that lists what medicines you take. General instructions  Drink enough fluid to keep your urine pale yellow.  Wear compression stockings as told by your health care provider.  Do not use any products that contain nicotine or tobacco, such as cigarettes and e-cigarettes. If you need help quitting, ask your health care provider.  Keep all follow-up visits as told by your health care provider. This is important. Contact a health care provider if:  You miss a dose of your blood thinner, if applicable.  Your symptoms do not improve.  You have unusual bruising.  You have nausea, vomiting, or diarrhea that lasts for more than one day. Get help right away if:  You have any of these problems: ? New or worse pain, swelling, or redness in an arm or leg. ? Numbness or tingling in an arm or leg. ? Shortness of breath. ? Chest pain. ? Severe pain in your abdomen. ? Fast breathing. ? A fast or irregular heartbeat. ? Blood in your vomit, stool, or urine. ? A severe headache or confusion. ? A cut that does not stop bleeding.  You feel light-headed or dizzy.  You cough up blood.  You have a serious fall or accident, or you hit your head. These symptoms may represent a serious  problem that is an emergency. Do not wait to see if the symptoms will go away. Get medical help right away. Call your local emergency services (911 in the [place] Do not drive yourself to the hospital. Summary  Thrombophlebitis is a condition in which a blood clot forms in a vein. This can happen in a vein close to the surface of the body or a vein deep inside the body.  This condition can cause serious problems when it happens in a vein deep inside the body (deep vein thrombosis, DVT).  The main symptom of this condition is swelling and pain around the affected vein.  Treatment may include warm compresses, anti-inflammatory medicines, or blood thinners. This information is not intended to replace advice given to you by your health care provider. Make sure you discuss any questions you have with your health care provider. Document Released: 06/18/2016  Document Revised: 06/18/2016 Document Reviewed: 06/18/2016 Elsevier Interactive Patient Education  2019 [employer]  Hematoma A hematoma is a collection of blood under the skin, in an organ, in a body space, in a joint space, or in other tissue. The blood can thicken (clot) to form a lump that you can see and feel. The lump is often firm and may become sore and tender. Most hematomas get better in a few days to weeks. However, some hematomas may be serious and require medical care. Hematomas can range from very small to very large. What are the causes? This condition is caused by:  A blunt or penetrating injury.  A leakage from a blood vessel under the skin.  Some medical procedures, including surgeries, such as oral surgery, face lifts, and surgeries on the joints.  Some medical conditions that cause bleeding or bruising. There may be multiple hematomas that appear in different areas of the body. What increases the risk? You are more likely to develop this condition if:  You are an older adult.  You use blood thinners. What are the  signs or symptoms?  Symptoms of this condition depend on where the hematoma is located.  Common symptoms of a hematoma that is under the skin include:  A firm lump on the body.  Pain and tenderness in the area.  Bruising. Blue, dark blue, purple-red, or yellowish skin (discoloration) may appear at the site of the hematoma if the hematoma is close to the surface of the skin. Common symptoms of a hematoma that is deep in the tissues or body spaces may be less obvious. They include:  A collection of blood in the stomach (intra-abdominal hematoma). This may cause pain in the abdomen, weakness, fainting, and shortness of breath.  A collection of blood in the head (intracranial hematoma). This may cause a headache or symptoms such as weakness, trouble speaking or understanding, or a change in consciousness. How is this diagnosed? This condition is diagnosed based on:  Your medical history.  A physical exam.  Imaging tests, such as an ultrasound or CT scan. These may be needed if your health care provider suspects a hematoma in deeper tissues or body spaces.  Blood tests. These may be needed if your health care provider believes that the hematoma is caused by a medical condition. How is this treated? Treatment for this condition depends on the cause, size, and location of the hematoma. Treatment may include:  Doing nothing. The majority of hematomas do not need treatment as many of them go away on their own over time.  Surgery or close monitoring. This may be needed for large hematomas or hematomas that affect vital organs.  Medicines. Medicines may be given if there is an underlying medical cause for the hematoma. Follow these instructions at home: Managing pain, stiffness, and swelling   If directed, put ice on the affected area. ? Put ice in a plastic bag. ? Place a towel between your skin and the bag. ? Leave the ice on for 20 minutes, 2-3 times a day for the first couple of  days.  If directed, apply heat to the affected area after applying ice for a couple of days. Use the heat source that your health care provider recommends, such as a moist heat pack or a heating pad. ? Place a towel between your skin and the heat source. ? Leave the heat on for 20-30 minutes. ? Remove the heat if your skin turns bright red. This is especially  important if you are unable to feel pain, heat, or cold. You may have a greater risk of getting burned.  Raise (elevate) the affected area above the level of your heart while you are sitting or lying down.  If told, wrap the affected area with an elastic bandage. The bandage applies pressure (compression) to the area, which may help to reduce swelling and promote healing. Do not wrap the bandage too tightly around the affected area.  If your hematoma is on a leg or foot (lower extremity) and is painful, your health care provider may recommend crutches. Use them as told by your health care provider. General instructions  Take over-the-counter and prescription medicines only as told by your health care provider.  Keep all follow-up visits as told by your health care provider. This is important. Contact a health care provider if:  You have a fever.  The swelling or discoloration gets worse.  You develop more hematomas. Get help right away if:  Your pain is worse or your pain is not controlled with medicine.  Your skin over the hematoma breaks or starts bleeding.  Your hematoma is in your chest or abdomen and you have weakness, shortness of breath, or a change in consciousness.  You have a hematoma on your scalp that is caused by a fall or injury, and you also have: ? A headache that gets worse. ? Trouble speaking or understanding speech. ? Weakness. ? Change in alertness or consciousness. Summary  A hematoma is a collection of blood under the skin, in an organ, in a body space, in a joint space, or in other tissue.  This  condition usually does not need treatment because many hematomas go away on their own over time.  Large hematomas, or those that may affect vital organs, may need surgical drainage or monitoring. If the hematoma is caused by a medical condition, medicines may be prescribed.  Get help right away if your hematoma breaks or starts to bleed, you have shortness of breath, or you have a headache or trouble speaking after a fall. This information is not intended to replace advice given to you by your health care provider. Make sure you discuss any questions you have with your health care provider. Document Released: 08/07/2003 Document Revised: 05/28/2017 Document Reviewed: 05/28/2017 Elsevier Interactive Patient Education  2019 [employer].

## 2018-06-15 NOTE — Telephone Encounter (Signed)
Pt wanted to follow up on her xray results on her leg she had xray done last Friday. Please advise?   Call pt @ [phone] Thank you!

## 2018-06-24 ENCOUNTER — Encounter: Payer: Self-pay | Admitting: Internal Medicine

## 2018-07-12 DIAGNOSIS — D2272 Melanocytic nevi of left lower limb, including hip: Secondary | ICD-10-CM | POA: Diagnosis not present

## 2018-07-12 DIAGNOSIS — Z85828 Personal history of other malignant neoplasm of skin: Secondary | ICD-10-CM | POA: Diagnosis not present

## 2018-07-12 DIAGNOSIS — D2261 Melanocytic nevi of right upper limb, including shoulder: Secondary | ICD-10-CM | POA: Diagnosis not present

## 2018-07-12 DIAGNOSIS — L820 Inflamed seborrheic keratosis: Secondary | ICD-10-CM | POA: Diagnosis not present

## 2018-07-12 DIAGNOSIS — Z08 Encounter for follow-up examination after completed treatment for malignant neoplasm: Secondary | ICD-10-CM | POA: Diagnosis not present

## 2018-07-12 DIAGNOSIS — D485 Neoplasm of uncertain behavior of skin: Secondary | ICD-10-CM | POA: Diagnosis not present

## 2018-07-12 DIAGNOSIS — L821 Other seborrheic keratosis: Secondary | ICD-10-CM | POA: Diagnosis not present

## 2018-07-12 DIAGNOSIS — D2262 Melanocytic nevi of left upper limb, including shoulder: Secondary | ICD-10-CM | POA: Diagnosis not present

## 2018-07-12 DIAGNOSIS — D225 Melanocytic nevi of trunk: Secondary | ICD-10-CM | POA: Diagnosis not present

## 2018-07-12 DIAGNOSIS — D2271 Melanocytic nevi of right lower limb, including hip: Secondary | ICD-10-CM | POA: Diagnosis not present

## 2018-09-04 DIAGNOSIS — Z23 Encounter for immunization: Secondary | ICD-10-CM | POA: Diagnosis not present

## 2018-09-30 ENCOUNTER — Other Ambulatory Visit: Payer: Self-pay

## 2018-09-30 ENCOUNTER — Ambulatory Visit (INDEPENDENT_AMBULATORY_CARE_PROVIDER_SITE_OTHER): Payer: MEDICARE

## 2018-09-30 DIAGNOSIS — Z0000 Encounter for general adult medical examination without abnormal findings: Secondary | ICD-10-CM | POA: Diagnosis not present

## 2018-09-30 NOTE — Patient Instructions (Addendum)
  Monica Salazar , Thank you for taking time to come for your Medicare Wellness Visit. I appreciate your ongoing commitment to your health goals. Please review the following plan we discussed and let me know if I can assist you in the future.   These are the goals we discussed: Goals    . Follow up with Provider as scheduled       This is a list of the screening recommended for you and due dates:  Health Maintenance  Topic Date Due  . Tetanus Vaccine  01/19/1970  . Mammogram  01/19/2001  . Pneumonia vaccines (1 of 2 - PCV13) 01/20/2016  . Colon Cancer Screening  06/11/2019*  . Flu Shot  Completed  . DEXA scan (bone density measurement)  Completed  .  Hepatitis C: One time screening is recommended by Center for Disease Control  (CDC) for  adults born from [birth year] through 1964.   Completed  *Topic was postponed. The date shown is not the original due date.

## 2018-09-30 NOTE — Progress Notes (Signed)
Subjective:   Monica Salazar is a 68 y.o. female who presents for an Initial Medicare Annual Wellness Visit.  Review of Systems    No ROS.  Medicare Wellness Virtual Visit.  Visual/audio telehealth visit, UTA vital signs.   See social history for additional risk factors.    Cardiac Risk Factors include: advanced age (>81men, >64 women)     Objective:    Today's Vitals   There is no height or weight on file to calculate BMI.  Advanced Directives 09/30/2018  Does Patient Have a Medical Advance Directive? Yes  Type of Paramedic of [place];Living will  Does patient want to make changes to medical advance directive? No - Patient declined  Copy of [place] in Chart? No - copy requested    Current Medications (verified) Outpatient Encounter Medications as of 09/30/2018  Medication Sig  . diclofenac sodium (VOLTAREN) 1 % GEL Apply topically every 14 (fourteen) days.  . Zoster Vaccine Live, PF, (ZOSTAVAX) 60454 UNT/0.65ML injection Zostavax (PF) 19,400 unit/0.65 mL subcutaneous suspension  . [DISCONTINUED] Influenza vac split quadrivalent PF (FLUARIX) 0.5 ML injection Fluarix Quad 2017-2018 (PF) 60 mcg (15 mcg x 4)/0.5 mL IM syringe  . [DISCONTINUED] typhoid (VIVOTIF) DR capsule Vivotif 2 billion unit capsule,delayed release   No facility-administered encounter medications on file as of 09/30/2018.     Allergies (verified) Patient has no known allergies.   History: Past Medical History:  Diagnosis Date  . Arthritis    hands  . Hearing loss    left ear 09/27/16 MR, CT negative etiology she had rash neck 2 days before hearing loss left ear so ? virus  . History of chicken pox   . Migraine    tries Excedrine migraine-normally sees swigly lines/distorted vision unable to talk, left arm recently numb; senstive light and sound  . Osteoporosis   . UTI (urinary tract infection)   . Vitamin D deficiency    Past Surgical History:   Procedure Laterality Date  . CESAREAN SECTION     1986  . hospitalization     2006 untx'ed UTI  . TONSILLECTOMY  1966   Family History  Problem Relation Age of Onset  . Lung cancer Mother   . Early death Mother   . Cancer Mother        lung cancer smoker   . Heart attack Father   . Stroke Father   . Heart disease Father   . Arthritis Brother        rheumatoid  . Migraines Brother   . Migraines Daughter   . Diabetes Maternal Grandmother        died comp. DM 2   . Early death Maternal Grandmother   . Hypertension Maternal Grandmother   . Early death Paternal Grandfather   . Hearing loss Paternal Grandfather    Social History   Socioeconomic History  . Marital status: Married    Spouse name: Not on file  . Number of children: Not on file  . Years of education: Not on file  . Highest education level: Not on file  Occupational History  . Not on file  Social Needs  . Financial resource strain: Not hard at all  . Food insecurity    Worry: Never true    Inability: Never true  . Transportation needs    Medical: No    Non-medical: No  Tobacco Use  . Smoking status: Never Smoker  . Smokeless tobacco: Never Used  Substance  and Sexual Activity  . Alcohol use: Never    Frequency: Never  . Drug use: Never  . Sexual activity: Yes  Lifestyle  . Physical activity    Days per week: 3 days    Minutes per session: 40 min  . Stress: Not at all  Relationships  . Social Herbalist on phone: Not on file    Gets together: Not on file    Attends religious service: Not on file    Active member of club or organization: Not on file    Attends meetings of clubs or organizations: Not on file    Relationship status: Not on file  Other Topics Concern  . Not on file  Social History Narrative   Married [name]    1 kid    And 1 stepkid and 1 adopted kid    Retired Scientist, water quality    Owns guns, wears seat belt, safe in relationship     Tobacco Counseling  Counseling given: Not Answered   Clinical Intake:  Pre-visit preparation completed: Yes        Diabetes: No  How often do you need to have someone help you when you read instructions, pamphlets, or other written materials from your doctor or pharmacy?: 1 - Never  Interpreter Needed?: No      Activities of Daily Living In your present state of health, do you have any difficulty performing the following activities: 09/30/2018  Hearing? Y  Comment Left ear hearing loss  Vision? N  Difficulty concentrating or making decisions? N  Walking or climbing stairs? N  Dressing or bathing? N  Doing errands, shopping? N  Preparing Food and eating ? N  Using the Toilet? N  In the past six months, have you accidently leaked urine? N  Do you have problems with loss of bowel control? N  Managing your Medications? N  Managing your Finances? N  Housekeeping or managing your Housekeeping? N  Some recent data might be hidden     Immunizations and Health Maintenance Immunization History  Administered Date(s) Administered  . Fluad Quad(high Dose 65+) 09/04/2018  . Influenza, High Dose Seasonal PF 10/21/2017   Health Maintenance Due  Topic Date Due  . TETANUS/TDAP  01/19/1970  . MAMMOGRAM  01/19/2001  . PNA vac Low Risk Adult (1 of 2 - PCV13) 01/20/2016    Patient Care Team: McLean-Scocuzza, Nino Glow, MD as PCP - General (Internal Medicine)  Indicate any recent Medical Services you may have received from other than [facility] providers in the past year (date may be approximate).     Assessment:   This is a routine wellness examination for Monica Salazar.  I connected with patient 09/30/18 at  9:30 AM EDT by an audio enabled telemedicine application and verified that I am speaking with the correct person using two identifiers. Patient stated full name and DOB. Patient gave permission to continue with virtual visit. Patient's location was at home and Nurse's location was at [place] office.    Health Maintenance Due: -PNA - discussed; she plans to check her records for completion. -Tdap- discussed; she plans to check her records for completion.   Update all pending maintenance due as appropriate.   See completed HM at the end of note.   Eye: Visual acuity not assessed. Virtual visit. Wears corrective lenses. Followed by their ophthalmologist. Followed by [facility] (Dr. Wallace Going). 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  Dental: Visits every 6 months.  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 Followed by [facility]. She wears a microphone on her left ear to pick up sound from the left side and direct to the right ear.  Safety:  Patient feels safe at home- yes Patient does have smoke detectors at home- yes Patient does wear sunscreen or protective clothing when in direct sunlight - yes Patient does wear seat belt when in a moving vehicle - yes Patient drives- yes Adequate lighting in walkways free from debris- yes Grab bars and handrails used as appropriate- yes Ambulates with no assistive device Cell phone on person when ambulating outside of the home- yes  Social: Alcohol intake - no     Smoking history- never   Smokers in home? none Illicit drug use? none  Depression: PHQ 2 &9 complete. See screening below. Denies irritability, anhedonia, sadness/tearfullness.  Stable.   Falls: See screening below.    Medication: Taking as directed and without issues.   Covid-19: Precautions and sickness symptoms discussed. Wears mask, social distancing, hand hygiene as appropriate.   Activities of Daily Living Patient denies needing assistance with: household chores, feeding themselves, getting from bed to chair, getting to the toilet, bathing/showering, dressing, managing money, or preparing meals.   Memory: Patient is alert. Patient denies difficulty focusing or concentrating. Correctly identified the president of the [place], season and recall.  BMI- discussed the  importance of a healthy diet, water intake and the benefits of aerobic exercise.  Educational material provided.  Physical activity- bicycles, weights every other day 40 minutes  Diet: Healthy Water: good intake  Other Providers Patient Care Team: McLean-Scocuzza, Nino Glow, MD as PCP - General (Internal Medicine)  Hearing/Vision screen  Hearing Screening   125Hz  250Hz  500Hz  1000Hz  2000Hz  3000Hz  4000Hz  6000Hz  8000Hz   Right ear:           Left ear:           Comments: Followed by  ENT  Hearing loss L ear. She wears a microphone on her left ear to pick up sound from the left side and direct to the right ear. (cross hearing aid).  Vision Screening Comments: Followed by Dr. Wallace Going [facility]) Wears corrective lenses Visual acuity not assessed, virtual visit.  They have seen their ophthalmologist in the last 12 months.     Dietary issues and exercise activities discussed: Current Exercise Habits: Home exercise routine, Type of exercise: calisthenics(weights), Time (Minutes): 40, Frequency (Times/Week): 3, Weekly Exercise (Minutes/Week): 120, Intensity: Mild  Goals    . Follow up with Provider as scheduled      Depression Screen PHQ 2/9 Scores 09/30/2018 06/11/2018  PHQ - 2 Score 0 0    Fall Risk Fall Risk  09/30/2018 06/11/2018  Falls in the past year? 0 1  Number falls in past yr: - 0   Timed Get Up and Go Performed no, virtual visit  Cognitive Function:     6CIT Screen 09/30/2018  What Year? 0 points  What month? 0 points  What time? 0 points  Count back from 20 0 points  Months in reverse 0 points  Repeat phrase 0 points  Total Score 0    Screening Tests Health Maintenance  Topic Date Due  . TETANUS/TDAP  01/19/1970  . MAMMOGRAM  01/19/2001  . PNA vac Low Risk Adult (1 of 2 - PCV13) 01/20/2016  . COLONOSCOPY  06/11/2019 (Originally 01/19/2001)  . INFLUENZA VACCINE  Completed  . DEXA SCAN  Completed  . Hepatitis C Screening  Completed  Plan:   Keep all routine maintenance appointments.   Follow up @ 11/23/18  Medicare Attestation I have personally reviewed: The patient's medical and social history Their use of alcohol, tobacco or illicit drugs Their current medications and supplements The patient's functional ability including ADLs,fall risks, home safety risks, cognitive, and hearing and visual impairment Diet and physical activities Evidence for depression    I have personally reviewed and noted the following in the patient's chart:   . Medical and social history . Use of alcohol, tobacco or illicit drugs  . Current medications and supplements . Functional ability and status . Nutritional status . Physical activity . Advanced directives . List of other physicians . Hospitalizations, surgeries, and ER visits in previous 12 months . Vitals . Screenings to include cognitive, depression, and falls . Referrals and appointments  In addition, I have reviewed and discussed with patient certain preventive protocols, quality metrics, and best practice recommendations. A written personalized care plan for preventive services as well as general preventive health recommendations were provided to patient.     Varney Biles, LPN   [id number]

## 2018-10-05 DIAGNOSIS — H469 Unspecified optic neuritis: Secondary | ICD-10-CM | POA: Diagnosis not present

## 2018-10-05 DIAGNOSIS — R9082 White matter disease, unspecified: Secondary | ICD-10-CM | POA: Diagnosis not present

## 2018-11-01 DIAGNOSIS — H47011 Ischemic optic neuropathy, right eye: Secondary | ICD-10-CM | POA: Diagnosis not present

## 2018-11-15 DIAGNOSIS — H90A22 Sensorineural hearing loss, unilateral, left ear, with restricted hearing on the contralateral side: Secondary | ICD-10-CM | POA: Diagnosis not present

## 2018-11-15 DIAGNOSIS — H6123 Impacted cerumen, bilateral: Secondary | ICD-10-CM | POA: Diagnosis not present

## 2018-11-22 ENCOUNTER — Telehealth: Payer: Self-pay | Admitting: Internal Medicine

## 2018-11-22 NOTE — Telephone Encounter (Signed)
I called pt twice and was unable to leave msg.

## 2018-11-23 ENCOUNTER — Other Ambulatory Visit: Payer: Self-pay

## 2018-11-23 ENCOUNTER — Ambulatory Visit (INDEPENDENT_AMBULATORY_CARE_PROVIDER_SITE_OTHER): Payer: MEDICARE | Admitting: Internal Medicine

## 2018-11-23 DIAGNOSIS — H47011 Ischemic optic neuropathy, right eye: Secondary | ICD-10-CM | POA: Insufficient documentation

## 2018-11-23 DIAGNOSIS — E785 Hyperlipidemia, unspecified: Secondary | ICD-10-CM

## 2018-11-23 DIAGNOSIS — E538 Deficiency of other specified B group vitamins: Secondary | ICD-10-CM | POA: Insufficient documentation

## 2018-11-23 DIAGNOSIS — H469 Unspecified optic neuritis: Secondary | ICD-10-CM | POA: Diagnosis not present

## 2018-11-23 DIAGNOSIS — Z1329 Encounter for screening for other suspected endocrine disorder: Secondary | ICD-10-CM

## 2018-11-23 DIAGNOSIS — Z1389 Encounter for screening for other disorder: Secondary | ICD-10-CM

## 2018-11-23 DIAGNOSIS — Z0000 Encounter for general adult medical examination without abnormal findings: Secondary | ICD-10-CM

## 2018-11-23 DIAGNOSIS — M810 Age-related osteoporosis without current pathological fracture: Secondary | ICD-10-CM

## 2018-11-23 NOTE — Patient Instructions (Signed)
Rock tape [employer]  Back brace   Sciatica  Sciatica is pain, numbness, weakness, or tingling along the path of the sciatic nerve. The sciatic nerve starts in the lower back and runs down the back of each leg. The nerve controls the muscles in the lower leg and in the back of the knee. It also provides feeling (sensation) to the back of the thigh, the lower leg, and the sole of the foot. Sciatica is a symptom of another medical condition that pinches or puts pressure on the sciatic nerve. Sciatica most often only affects one side of the body. Sciatica usually goes away on its own or with treatment. In some cases, sciatica may come back (recur). What are the causes? This condition is caused by pressure on the sciatic nerve or pinching of the nerve. This may be the result of:  A disk in between the bones of the spine bulging out too far (herniated disk).  Age-related changes in the spinal disks.  A pain disorder that affects a muscle in the buttock.  Extra bone growth near the sciatic nerve.  A break (fracture) of the pelvis.  Pregnancy.  Tumor. This is rare. What increases the risk? The following factors may make you more likely to develop this condition:  Playing sports that place pressure or stress on the spine.  Having poor strength and flexibility.  A history of back injury or surgery.  Sitting for long periods of time.  Doing activities that involve repetitive bending or lifting.  Obesity. What are the signs or symptoms? Symptoms can vary from mild to very severe, and they may include:  Any of these problems in the lower back, leg, hip, or buttock: ? Mild tingling, numbness, or dull aches. ? Burning sensations. ? Sharp pains.  Numbness in the back of the calf or the sole of the foot.  Leg weakness.  Severe back pain that makes movement difficult. Symptoms may get worse when you cough, sneeze, or laugh, or when you sit or stand for long periods of time. How is this  diagnosed? This condition may be diagnosed based on:  Your symptoms and medical history.  A physical exam.  Blood tests.  Imaging tests, such as: ? X-rays. ? MRI. ? CT scan. How is this treated? In many cases, this condition improves on its own without treatment. However, treatment may include:  Reducing or modifying physical activity.  Exercising and stretching.  Icing and applying heat to the affected area.  Medicines that help to: ? Relieve pain and swelling. ? Relax your muscles.  Injections of medicines that help to relieve pain, irritation, and inflammation around the sciatic nerve (steroids).  Surgery. Follow these instructions at home: Medicines  Take over-the-counter and prescription medicines only as told by your health care provider.  Ask your health care provider if the medicine prescribed to you: ? Requires you to avoid driving or using heavy machinery. ? Can cause constipation. You may need to take these actions to prevent or treat constipation:  Drink enough fluid to keep your urine pale yellow.  Take over-the-counter or prescription medicines.  Eat foods that are high in fiber, such as beans, whole grains, and fresh fruits and vegetables.  Limit foods that are high in fat and processed sugars, such as fried or sweet foods. Managing pain      If directed, put ice on the affected area. ? Put ice in a plastic bag. ? Place a towel between your skin and the bag. ?  Leave the ice on for 20 minutes, 2-3 times a day.  If directed, apply heat to the affected area. Use the heat source that your health care provider recommends, such as a moist heat pack or a heating pad. ? Place a towel between your skin and the heat source. ? Leave the heat on for 20-30 minutes. ? Remove the heat if your skin turns bright red. This is especially important if you are unable to feel pain, heat, or cold. You may have a greater risk of getting burned. Activity   Return  to your normal activities as told by your health care provider. Ask your health care provider what activities are safe for you.  Avoid activities that make your symptoms worse.  Take brief periods of rest throughout the day. ? When you rest for longer periods, mix in some mild activity or stretching between periods of rest. This will help to prevent stiffness and pain. ? Avoid sitting for long periods of time without moving. Get up and move around at least one time each hour.  Exercise and stretch regularly, as told by your health care provider.  Do not lift anything that is heavier than 10 lb (4.5 kg) while you have symptoms of sciatica. When you do not have symptoms, you should still avoid heavy lifting, especially repetitive heavy lifting.  When you lift objects, always use proper lifting technique, which includes: ? Bending your knees. ? Keeping the load close to your body. ? Avoiding twisting. General instructions  Maintain a healthy weight. Excess weight puts extra stress on your back.  Wear supportive, comfortable shoes. Avoid wearing high heels.  Avoid sleeping on a mattress that is too soft or too hard. A mattress that is firm enough to support your back when you sleep may help to reduce your pain.  Keep all follow-up visits as told by your health care provider. This is important. Contact a health care provider if:  You have pain that: ? Wakes you up when you are sleeping. ? Gets worse when you lie down. ? Is worse than you have experienced in the past. ? Lasts longer than 4 weeks.  You have an unexplained weight loss. Get help right away if:  You are not able to control when you urinate or have bowel movements (incontinence).  You have: ? Weakness in your lower back, pelvis, buttocks, or legs that gets worse. ? Redness or swelling of your back. ? A burning sensation when you urinate. Summary  Sciatica is pain, numbness, weakness, or tingling along the path of the  sciatic nerve.  This condition is caused by pressure on the sciatic nerve or pinching of the nerve.  Sciatica can cause pain, numbness, or tingling in the lower back, legs, hips, and buttocks.  Treatment often includes rest, exercise, medicines, and applying ice or heat. This information is not intended to replace advice given to you by your health care provider. Make sure you discuss any questions you have with your health care provider. Document Released: 12/17/2000 Document Revised: 01/11/2018 Document Reviewed: 01/11/2018 Elsevier Patient Education  [employer].   Back Exercises The following exercises strengthen the muscles that help to support the trunk and back. They also help to keep the lower back flexible. Doing these exercises can help to prevent back pain or lessen existing pain.  If you have back pain or discomfort, try doing these exercises 2-3 times each day or as told by your health care provider.  As your  pain improves, do them once each day, but increase the number of times that you repeat the steps for each exercise (do more repetitions).  To prevent the recurrence of back pain, continue to do these exercises once each day or as told by your health care provider. Do exercises exactly as told by your health care provider and adjust them as directed. It is normal to feel mild stretching, pulling, tightness, or discomfort as you do these exercises, but you should stop right away if you feel sudden pain or your pain gets worse. Exercises Single knee to chest Repeat these steps 3-5 times for each leg: 1. Lie on your back on a firm bed or the floor with your legs extended. 2. Bring one knee to your chest. Your other leg should stay extended and in contact with the floor. 3. Hold your knee in place by grabbing your knee or thigh with both hands and hold. 4. Pull on your knee until you feel a gentle stretch in your lower back or buttocks. 5. Hold the stretch for 10-30  seconds. 6. Slowly release and straighten your leg. Pelvic tilt Repeat these steps 5-10 times: 1. Lie on your back on a firm bed or the floor with your legs extended. 2. Bend your knees so they are pointing toward the ceiling and your feet are flat on the floor. 3. Tighten your lower abdominal muscles to press your lower back against the floor. This motion will tilt your pelvis so your tailbone points up toward the ceiling instead of pointing to your feet or the floor. 4. With gentle tension and even breathing, hold this position for 5-10 seconds. Cat-cow Repeat these steps until your lower back becomes more flexible: 1. Get into a hands-and-knees position on a firm surface. Keep your hands under your shoulders, and keep your knees under your hips. You may place padding under your knees for comfort. 2. Let your head hang down toward your chest. Contract your abdominal muscles and point your tailbone toward the floor so your lower back becomes rounded like the back of a cat. 3. Hold this position for 5 seconds. 4. Slowly lift your head, let your abdominal muscles relax and point your tailbone up toward the ceiling so your back forms a sagging arch like the back of a cow. 5. Hold this position for 5 seconds.  Press-ups Repeat these steps 5-10 times: 1. Lie on your abdomen (face-down) on the floor. 2. Place your palms near your head, about shoulder-width apart. 3. Keeping your back as relaxed as possible and keeping your hips on the floor, slowly straighten your arms to raise the top half of your body and lift your shoulders. Do not use your back muscles to raise your upper torso. You may adjust the placement of your hands to make yourself more comfortable. 4. Hold this position for 5 seconds while you keep your back relaxed. 5. Slowly return to lying flat on the floor.  Bridges Repeat these steps 10 times: 1. Lie on your back on a firm surface. 2. Bend your knees so they are pointing toward  the ceiling and your feet are flat on the floor. Your arms should be flat at your sides, next to your body. 3. Tighten your buttocks muscles and lift your buttocks off the floor until your waist is at almost the same height as your knees. You should feel the muscles working in your buttocks and the back of your thighs. If you do not feel these  muscles, slide your feet 1-2 inches farther away from your buttocks. 4. Hold this position for 3-5 seconds. 5. Slowly lower your hips to the starting position, and allow your buttocks muscles to relax completely. If this exercise is too easy, try doing it with your arms crossed over your chest. Abdominal crunches Repeat these steps 5-10 times: 1. Lie on your back on a firm bed or the floor with your legs extended. 2. Bend your knees so they are pointing toward the ceiling and your feet are flat on the floor. 3. Cross your arms over your chest. 4. Tip your chin slightly toward your chest without bending your neck. 5. Tighten your abdominal muscles and slowly raise your trunk (torso) high enough to lift your shoulder blades a tiny bit off the floor. Avoid raising your torso higher than that because it can put too much stress on your low back and does not help to strengthen your abdominal muscles. 6. Slowly return to your starting position. Back lifts Repeat these steps 5-10 times: 1. Lie on your abdomen (face-down) with your arms at your sides, and rest your forehead on the floor. 2. Tighten the muscles in your legs and your buttocks. 3. Slowly lift your chest off the floor while you keep your hips pressed to the floor. Keep the back of your head in line with the curve in your back. Your eyes should be looking at the floor. 4. Hold this position for 3-5 seconds. 5. Slowly return to your starting position. Contact a health care provider if:  Your back pain or discomfort gets much worse when you do an exercise.  Your worsening back pain or discomfort does  not lessen within 2 hours after you exercise. If you have any of these problems, stop doing these exercises right away. Do not do them again unless your health care provider says that you can. Get help right away if:  You develop sudden, severe back pain. If this happens, stop doing the exercises right away. Do not do them again unless your health care provider says that you can. This information is not intended to replace advice given to you by your health care provider. Make sure you discuss any questions you have with your health care provider. Document Released: 01/31/2004 Document Revised: 04/29/2018 Document Reviewed: 09/24/2017 Elsevier Patient Education  2020 [employer].

## 2018-11-23 NOTE — Progress Notes (Signed)
Telephone Note  I connected with Monica Salazar  on 11/23/18 at 10:30 AM EST by telephone and verified that I am speaking with the correct person using two identifiers.  Location patient: home Location provider:work or home office Persons participating in the virtual visit: patient, provider  I discussed the limitations of evaluation and management by telemedicine and the availability of in person appointments. The patient expressed understanding and agreed to proceed.   HPI: 1. Osteoporosis on boniva in the past and caused jaw pain last dexa 10/06/16 + due for this and mammogram  2. C/o right lower leg pain since childhood and calf pain age 68 y.o massage by mother helped muscle bx negative in the past age 68 y.o sometimes with exercise sxs worse then will improve 3. 03/2018 had optic nerve swelling and MRI/A negative as well as labs 03/19/18 [facility] had f/u 11/2018 and will f/u in 6 months   ROS: See pertinent positives and negatives per HPI.  Past Medical History:  Diagnosis Date  . Arthritis    hands  . Hearing loss    left ear 09/27/16 MR, CT negative etiology she had rash neck 2 days before hearing loss left ear so ? virus  . History of chicken pox   . Migraine    tries Excedrine migraine-normally sees swigly lines/distorted vision unable to talk, left arm recently numb; senstive light and sound  . Osteoporosis   . UTI (urinary tract infection)   . Vitamin D deficiency     Past Surgical History:  Procedure Laterality Date  . CESAREAN SECTION     1986  . hospitalization     2006 untx'ed UTI  . TONSILLECTOMY  1966    Family History  Problem Relation Age of Onset  . Lung cancer Mother   . Early death Mother   . Cancer Mother        lung cancer smoker   . Heart attack Father   . Stroke Father   . Heart disease Father   . Arthritis Brother        rheumatoid  . Migraines Brother   . Migraines Daughter   . Diabetes Maternal Grandmother        died comp. DM 2   .  Early death Maternal Grandmother   . Hypertension Maternal Grandmother   . Early death Paternal Grandfather   . Hearing loss Paternal Grandfather     SOCIAL HX: married   Current Outpatient Medications:  .  Cholecalciferol (VITAMIN D3) 50 MCG (2000 UT) TABS, Take by mouth., Disp: , Rfl:  .  cyanocobalamin 1000 MCG tablet, Take 1,000 mcg by mouth daily., Disp: , Rfl:  .  Turmeric (QC TUMERIC COMPLEX PO), Take by mouth., Disp: , Rfl:  .  diclofenac sodium (VOLTAREN) 1 % GEL, Apply topically every 14 (fourteen) days., Disp: , Rfl:  .  Zoster Vaccine Live, PF, (ZOSTAVAX) 38466 UNT/0.65ML injection, Zostavax (PF) 19,400 unit/0.65 mL subcutaneous suspension, Disp: , Rfl:   EXAM:  VITALS per patient if applicable:  GENERAL: alert, oriented, appears well and in no acute distress  PSYCH/NEURO: pleasant and cooperative, no obvious depression or anxiety, speech and thought processing grossly intact  ASSESSMENT AND PLAN:  Discussed the following assessment and plan:  Osteoporosis, unspecified osteoporosis type, unspecified pathological fracture presence - Plan: DG Bone Density, Comprehensive metabolic panel,  Z99 deficiency - Plan: B12 1000 mcg qd otc   Hyperlipidemia, unspecified hyperlipidemia type - Plan: Lipid panel due 03/19/2019  Thyroid disorder screening -  Plan: TSH  Optic neuritis F/u [facility]   HM Flu shot utd Tdap 2017 per pt  Consider shingrix and prevnar or pna 23 if has not had  hcv negative  MMR immune  Vitamin D 42.2 03/19/18  Other labs cmet, cbc, tsh  Never smoker   Get records pap(h/o normal per pt) now out of age window colonoscopy 2011 mammogram 10/06/16 normal rec pt call and schedule  10/18 DEXAh/o osteoporosis (see abovewas given Boniva in the past noted 12/08/16 x 3 doses) -ordered  vaccines (Tdap, pna vaccines, zostervax) former PCP Dr. Shellee Milo in [place] [phone]   11/16/17 negative cologuard  -declines further  colonoscopieshad in ? 2011 no h/o polyps and no FH colon cancer  Referred dermatology bx normal in 2019/2020 f/u during pandemic normal 6 or 7 or 08/2018 f/u in 1 year to 6 months    Saw ENT 11/27/17 cerumen impaction b/l and sensorineural hearing loss -saw ENT 11/2018 and Q 6 months hearing loss in left ear  Saw Dr. Manuella Ghazi 03/19/18 Neurology MRI/A normal  [facility] saw 03/2018 optic neuritis Dr. Wallace Going saw in 11/2018 f/u had optic nerve thinned due to swelling but improved labs normal 03/19/2018  -f/u eye 05/5019     -we discussed possible serious and likely etiologies, options for evaluation and workup, limitations of telemedicine visit vs in person visit, treatment, treatment risks and precautions. Pt prefers to treat via telemedicine empirically rather then risking or undertaking an in person visit at this moment. Patient agrees to seek prompt in person care if worsening, new symptoms arise, or if is not improving with treatment.   I discussed the assessment and treatment plan with the patient. The patient was provided an opportunity to ask questions and all were answered. The patient agreed with the plan and demonstrated an understanding of the instructions.   The patient was advised to call back or seek an in-person evaluation if the symptoms worsen or if the condition fails to improve as anticipated.  Time spent 20 minutes  Delorise Jackson, MD

## 2019-02-13 ENCOUNTER — Ambulatory Visit: Payer: MEDICARE | Attending: Internal Medicine

## 2019-02-13 DIAGNOSIS — Z23 Encounter for immunization: Secondary | ICD-10-CM

## 2019-02-13 NOTE — Progress Notes (Signed)
   Covid-19 Vaccination Clinic  Name:  Monica Salazar    MRN: [id number] DOB: [birth year]  02/13/2019  [name] was observed post Covid-19 immunization for 15 minutes without incidence. She was provided with Vaccine Information Sheet and instruction to access the V-Safe system.   [name] was instructed to call 911 with any severe reactions post vaccine: [name] Kitchen Difficulty breathing  . Swelling of your face and throat  . A fast heartbeat  . A bad rash all over your body  . Dizziness and weakness    Immunizations Administered    Name Date Dose VIS Date Route   Pfizer COVID-19 Vaccine 02/13/2019  6:01 PM 0.3 mL 12/17/2018 Intramuscular   Manufacturer: [place]   Lot: [id number]   [place]: [id number]

## 2019-02-25 DIAGNOSIS — L720 Epidermal cyst: Secondary | ICD-10-CM | POA: Diagnosis not present

## 2019-02-25 DIAGNOSIS — Z85828 Personal history of other malignant neoplasm of skin: Secondary | ICD-10-CM | POA: Diagnosis not present

## 2019-02-25 DIAGNOSIS — L820 Inflamed seborrheic keratosis: Secondary | ICD-10-CM | POA: Diagnosis not present

## 2019-02-25 DIAGNOSIS — Z08 Encounter for follow-up examination after completed treatment for malignant neoplasm: Secondary | ICD-10-CM | POA: Diagnosis not present

## 2019-02-25 DIAGNOSIS — D485 Neoplasm of uncertain behavior of skin: Secondary | ICD-10-CM | POA: Diagnosis not present

## 2019-02-25 DIAGNOSIS — L738 Other specified follicular disorders: Secondary | ICD-10-CM | POA: Diagnosis not present

## 2019-02-25 DIAGNOSIS — L538 Other specified erythematous conditions: Secondary | ICD-10-CM | POA: Diagnosis not present

## 2019-03-10 ENCOUNTER — Ambulatory Visit: Payer: MEDICARE | Attending: Internal Medicine

## 2019-03-10 DIAGNOSIS — Z23 Encounter for immunization: Secondary | ICD-10-CM

## 2019-03-10 NOTE — Progress Notes (Signed)
   Covid-19 Vaccination Clinic  Name:  Monica Salazar    MRN: [id number] DOB: May 11, 1950  03/10/2019  Monica Salazar was observed post Covid-19 immunization for 15 minutes without incident. She was provided with Vaccine Information Sheet and instruction to access the V-Safe system.   Monica Salazar was instructed to call 911 with any severe reactions post vaccine: [name] Kitchen Difficulty breathing  . Swelling of face and throat  . A fast heartbeat  . A bad rash all over body  . Dizziness and weakness   Immunizations Administered    Name Date Dose VIS Date Route   Pfizer COVID-19 Vaccine 03/10/2019  3:56 PM 0.3 mL 12/17/2018 Intramuscular   Manufacturer: [place]   Lot: [id number]   [place]: [id number]

## 2019-03-16 ENCOUNTER — Other Ambulatory Visit: Payer: MEDICARE

## 2019-03-16 ENCOUNTER — Telehealth (INDEPENDENT_AMBULATORY_CARE_PROVIDER_SITE_OTHER): Payer: MEDICARE | Admitting: Internal Medicine

## 2019-03-16 ENCOUNTER — Encounter: Payer: Self-pay | Admitting: Internal Medicine

## 2019-03-16 DIAGNOSIS — R42 Dizziness and giddiness: Secondary | ICD-10-CM | POA: Insufficient documentation

## 2019-03-16 DIAGNOSIS — H47011 Ischemic optic neuropathy, right eye: Secondary | ICD-10-CM

## 2019-03-16 DIAGNOSIS — H9042 Sensorineural hearing loss, unilateral, left ear, with unrestricted hearing on the contralateral side: Secondary | ICD-10-CM | POA: Diagnosis not present

## 2019-03-16 MED ORDER — PREDNISONE 10 MG PO TABS: ORAL_TABLET | ORAL | 0 refills | Status: DC

## 2019-03-16 MED ORDER — ONDANSETRON 4 MG PO TBDP: 4.0000 mg | ORAL_TABLET | Freq: Three times a day (TID) | ORAL | 0 refills | Status: DC | PRN

## 2019-03-16 MED ORDER — DIAZEPAM 5 MG PO TABS: 5.0000 mg | ORAL_TABLET | Freq: Two times a day (BID) | ORAL | 0 refills | Status: DC | PRN

## 2019-03-16 NOTE — Assessment & Plan Note (Signed)
Her neurologic exam is normal based on what I can tell from a video visit.  Certainly her cerebellar function is intact by finger to nose.  May be due to dehydration /hyponatremia, will increase intake of fluids to include salt. Will treat as labrynthitis  With prednisone 60 mg x 3,  Then taper by 10 mg daily,  zofran for nausea,  And valium prn 5 mg.  Sees ENT tomorrow

## 2019-03-16 NOTE — Assessment & Plan Note (Addendum)
Occurred in Sept 2018.CT head was done in ER and negative.   MRI with IAC protocol was also negative .   Did not respond to prednisone    Susac syndrome suspected but apparently ruled out by [facility]

## 2019-03-16 NOTE — Progress Notes (Signed)
Virtual Visit via caregility Note  This visit type was conducted due to national recommendations for restrictions regarding the COVID-19 pandemic (e.g. social distancing).  This format is felt to be most appropriate for this patient at this time.  All issues noted in this document were discussed and addressed.  No physical exam was performed (except for noted visual exam findings with Video Visits).   I connected with@ on 03/16/19 at  4:30 PM EST by a video enabled telemedicine application and verified that I am speaking with the correct person using two identifiers. Location patient: home Location provider: work or home office Persons participating in the virtual visit: patient, provider  I discussed the limitations, risks, security and privacy concerns of performing an evaluation and management service by telephone and the availability of in person appointments. I also discussed with the patient that there may be a patient responsible charge related to this service. The patient expressed understanding and agreed to proceed.  Reason for visit: vertigo with nausea   HPI:  69 yr old female with history of sudden onset of Sensorineural  hearing loss in left ear in 2018 (permanent),  Partial Vision loss in right eye due to AION march 2020  With negative workup for Susac syndrome  per [facility] presents with sudden onset of vertigo which started this morning around 3 am.  Patient got up to void,  Room spinning,  Went back to bed and slept until 6 am when husband woke her for breakfast.  Vertigo returned,  Felt too weak to get out of bed and walk , bilateral arm and leg weakness,  Nauseated,  Vomited.denies fevers, headache, neck stiffness, ear pain,  Sore throat, but right ear is popping. .  Had the [place] AGO  And had Spent the last several daysfasting from sunup to sundown as is customary for the [place] [employer] until age 84. Fasting including no liquids for  12 hours.  And she notes that she was very active yesterday working in the yard and felt very dehydrated last night, but ate a regular dinner. Thus far today has had about 20 ounces of fluid (ginger ale and water) .  husband feeling fine. Seeing her  ENT tomorrow  Clyde Canterbury,  [facility]     ROS: See pertinent positives and negatives per HPI.  Past Medical History:  Diagnosis Date  . Arthritis    hands  . Hearing loss    left ear 09/27/16 MR, CT negative etiology she had rash neck 2 days before hearing loss left ear so ? virus  . History of chicken pox   . Migraine    tries Excedrine migraine-normally sees swigly lines/distorted vision unable to talk, left arm recently numb; senstive light and sound  . Osteoporosis   . UTI (urinary tract infection)   . Vitamin D deficiency     Past Surgical History:  Procedure Laterality Date  . CESAREAN SECTION     1986  . hospitalization     2006 untx'ed UTI  . TONSILLECTOMY  1966    Family History  Problem Relation Age of Onset  . Lung cancer Mother   . Early death Mother   . Cancer Mother        lung cancer smoker   . Heart attack Father   . Stroke Father   . Heart disease Father   . Arthritis Brother        rheumatoid  . Migraines Brother   .  Migraines Daughter   . Diabetes Maternal Grandmother        died comp. DM 2   . Early death Maternal Grandmother   . Hypertension Maternal Grandmother   . Early death Paternal Grandfather   . Hearing loss Paternal Grandfather     SOCIAL HX:  reports that she has never smoked. She has never used smokeless tobacco. She reports that she does not drink alcohol or use drugs.   Current Outpatient Medications:  .  Cholecalciferol (VITAMIN D3) 50 MCG (2000 UT) TABS, Take by mouth., Disp: , Rfl:  .  cyanocobalamin 1000 MCG tablet, Take 1,000 mcg by mouth daily., Disp: , Rfl:  .  diclofenac sodium (VOLTAREN) 1 % GEL, Apply topically every 14 (fourteen) days., Disp: , Rfl:  .  Turmeric (QC  TUMERIC COMPLEX PO), Take by mouth., Disp: , Rfl:  .  diazepam (VALIUM) 5 MG tablet, Take 1 tablet (5 mg total) by mouth every 12 (twelve) hours as needed for anxiety. Or vertigo, Disp: 20 tablet, Rfl: 0 .  ondansetron (ZOFRAN ODT) 4 MG disintegrating tablet, Take 1 tablet (4 mg total) by mouth every 8 (eight) hours as needed for nausea or vomiting., Disp: 20 tablet, Rfl: 0 .  predniSONE (DELTASONE) 10 MG tablet, 6 tablets daily for 3 days, then reduce by 1 tablet daily until gone, Disp: 33 tablet, Rfl: 0  EXAM:  VITALS per patient if applicable:  GENERAL: alert, oriented, appears weak but in no acute distress  HEENT: atraumatic, conjunttiva clear, no obvious abnormalities on inspection of external nose and ears  NECK: normal movements of the head and neck, no neck rigidity,  ROM normal   LUNGS: on inspection no signs of respiratory distress, breathing rate appears normal, no obvious gross SOB, gasping or wheezing  CV: no obvious cyanosis  MS: moves all visible extremities without noticeable abnormality  PSYCH: pleasant and cooperative,  speech and thought processing grossly intact NEURO: CNs intact,  Finger to nose (demonstrated with husband's help) intact bilaterally.  Face symmetric .  Can raise arms and legs .   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  Certainly her cerebellar function is intact by finger to nose.  May be due to dehydration /hyponatremia, will increase intake of fluids to include salt. Will treat as labrynthitis  With prednisone 60 mg x 3,  Then taper by 10 mg daily,  zofran for nausea,  And valium prn 5 mg.  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  MRA and MRI were normal .  ESR and CRP normal. Susac syndrome suspected but ruled out   Sensorineural hearing loss (SNHL) of left ear Occurred in Sept 2018.CT head was done in ER and negative.   MRI with IAC protocol was also negative .   Did not respond to prednisone    Susac syndrome suspected but apparently ruled out by [facility]    I discussed the assessment and treatment plan with the patient. The patient was provided an opportunity to ask questions and all were answered. The patient agreed with the plan and demonstrated an understanding of  the instructions.   The patient was advised to call back or seek an in-person evaluation if the symptoms worsen or if the condition fails to improve as anticipated.  I provided  40 minutes of non-face-to-face time during this encounter reviewing patient's current problems and past surgeries, labs and imaging studies, providing counseling on the above mentioned problems , and coordination  of care .  Crecencio Mc, MD

## 2019-03-16 NOTE — Assessment & Plan Note (Addendum)
Diagnosed by [facility] March 2020 after vision changes and partial loss of vision with photopsia reported .  MRA and MRI were normal .  ESR and CRP normal. Susac syndrome suspected but ruled out

## 2019-03-17 DIAGNOSIS — H90A21 Sensorineural hearing loss, unilateral, right ear, with restricted hearing on the contralateral side: Secondary | ICD-10-CM | POA: Diagnosis not present

## 2019-03-17 DIAGNOSIS — H8120 Vestibular neuronitis, unspecified ear: Secondary | ICD-10-CM | POA: Diagnosis not present

## 2019-03-17 DIAGNOSIS — H90A22 Sensorineural hearing loss, unilateral, left ear, with restricted hearing on the contralateral side: Secondary | ICD-10-CM | POA: Diagnosis not present

## 2019-03-21 ENCOUNTER — Other Ambulatory Visit: Payer: Self-pay

## 2019-03-21 ENCOUNTER — Other Ambulatory Visit (INDEPENDENT_AMBULATORY_CARE_PROVIDER_SITE_OTHER): Payer: MEDICARE

## 2019-03-21 ENCOUNTER — Ambulatory Visit
Admission: RE | Admit: 2019-03-21 | Discharge: 2019-03-21 | Disposition: A | Discharge status: Home / Self Care | Payer: MEDICARE | Source: Ambulatory Visit | Attending: Internal Medicine | Admitting: Internal Medicine

## 2019-03-21 ENCOUNTER — Other Ambulatory Visit: Payer: Self-pay | Admitting: Internal Medicine

## 2019-03-21 DIAGNOSIS — M810 Age-related osteoporosis without current pathological fracture: Secondary | ICD-10-CM | POA: Diagnosis not present

## 2019-03-21 DIAGNOSIS — Z1231 Encounter for screening mammogram for malignant neoplasm of breast: Secondary | ICD-10-CM | POA: Diagnosis not present

## 2019-03-21 DIAGNOSIS — Z1329 Encounter for screening for other suspected endocrine disorder: Secondary | ICD-10-CM | POA: Diagnosis not present

## 2019-03-21 DIAGNOSIS — Z0000 Encounter for general adult medical examination without abnormal findings: Secondary | ICD-10-CM | POA: Diagnosis not present

## 2019-03-21 DIAGNOSIS — E785 Hyperlipidemia, unspecified: Secondary | ICD-10-CM | POA: Diagnosis not present

## 2019-03-21 DIAGNOSIS — E538 Deficiency of other specified B group vitamins: Secondary | ICD-10-CM

## 2019-03-21 DIAGNOSIS — R739 Hyperglycemia, unspecified: Secondary | ICD-10-CM

## 2019-03-21 DIAGNOSIS — M8588 Other specified disorders of bone density and structure, other site: Secondary | ICD-10-CM | POA: Diagnosis not present

## 2019-03-21 DIAGNOSIS — Z780 Asymptomatic menopausal state: Secondary | ICD-10-CM | POA: Diagnosis not present

## 2019-03-21 DIAGNOSIS — Z1389 Encounter for screening for other disorder: Secondary | ICD-10-CM

## 2019-03-21 LAB — COMPREHENSIVE METABOLIC PANEL
ALT: 17 U/L (ref 0–35)
AST: 19 U/L (ref 0–37)
Albumin: 4 g/dL (ref 3.5–5.2)
Alkaline Phosphatase: 61 U/L (ref 39–117)
BUN: 14 mg/dL (ref 6–23)
CO2: 27 mEq/L (ref 19–32)
Calcium: 9.1 mg/dL (ref 8.4–10.5)
Chloride: 102 mEq/L (ref 96–112)
Creatinine, Ser: 0.66 mg/dL (ref 0.40–1.20)
GFR: 89.02 mL/min (ref >60.00)
Glucose, Bld: 118 mg/dL — ABNORMAL HIGH (ref 70–99)
Potassium: 4 mEq/L (ref 3.5–5.1)
Sodium: 138 mEq/L (ref 135–145)
Total Bilirubin: 0.6 mg/dL (ref 0.2–1.2)
Total Protein: 6.9 g/dL (ref 6.0–8.3)

## 2019-03-21 LAB — CBC WITH DIFFERENTIAL/PLATELET
Basophils Absolute: 0 10*3/uL (ref 0.0–0.1)
Basophils Relative: 0 % (ref 0.0–3.0)
Eosinophils Absolute: 0 10*3/uL (ref 0.0–0.7)
Eosinophils Relative: 0.1 % (ref 0.0–5.0)
HCT: 42.2 % (ref 36.0–46.0)
Hemoglobin: 14.5 g/dL (ref 12.0–15.0)
Lymphocytes Relative: 14.8 % (ref 12.0–46.0)
Lymphs Abs: 1.1 10*3/uL (ref 0.7–4.0)
MCHC: 34.2 g/dL (ref 30.0–36.0)
MCV: 91.5 fl (ref 78.0–100.0)
Monocytes Absolute: 0.5 10*3/uL (ref 0.1–1.0)
Monocytes Relative: 6.1 % (ref 3.0–12.0)
Neutro Abs: 6.1 10*3/uL (ref 1.4–7.7)
Neutrophils Relative %: 79 % — ABNORMAL HIGH (ref 43.0–77.0)
Platelets: 303 10*3/uL (ref 150.0–400.0)
RBC: 4.62 Mil/uL (ref 3.87–5.11)
RDW: 13.2 % (ref 11.5–15.5)
WBC: 7.7 10*3/uL (ref 4.0–10.5)

## 2019-03-21 LAB — LIPID PANEL
Cholesterol: 224 mg/dL — ABNORMAL HIGH (ref 0–200)
HDL: 75 mg/dL (ref >39.00)
LDL Cholesterol: 130 mg/dL — ABNORMAL HIGH (ref 0–99)
NonHDL: 148.75
Total CHOL/HDL Ratio: 3
Triglycerides: 96 mg/dL (ref 0.0–149.0)
VLDL: 19.2 mg/dL (ref 0.0–40.0)

## 2019-03-21 LAB — HEMOGLOBIN A1C: Hgb A1c MFr Bld: 5.6 % (ref 4.6–6.5)

## 2019-03-21 LAB — TSH: TSH: 2.6 u[IU]/mL (ref 0.35–4.50)

## 2019-03-21 LAB — VITAMIN B12: Vitamin B-12: 852 pg/mL (ref 211–911)

## 2019-03-22 NOTE — Addendum Note (Signed)
Addended by: Tor Netters I on: 03/22/2019 04:00 PM   Modules accepted: Orders

## 2019-03-25 ENCOUNTER — Encounter: Payer: Self-pay | Admitting: Internal Medicine

## 2019-03-25 ENCOUNTER — Ambulatory Visit (INDEPENDENT_AMBULATORY_CARE_PROVIDER_SITE_OTHER): Payer: MEDICARE | Admitting: Internal Medicine

## 2019-03-25 ENCOUNTER — Other Ambulatory Visit: Payer: Self-pay

## 2019-03-25 VITALS — BP 120/82 | HR 71 | Temp 96.7°F | Ht 60.08 in | Wt 130.0 lb

## 2019-03-25 DIAGNOSIS — M1990 Unspecified osteoarthritis, unspecified site: Secondary | ICD-10-CM | POA: Diagnosis not present

## 2019-03-25 DIAGNOSIS — N3000 Acute cystitis without hematuria: Secondary | ICD-10-CM

## 2019-03-25 DIAGNOSIS — M810 Age-related osteoporosis without current pathological fracture: Secondary | ICD-10-CM | POA: Diagnosis not present

## 2019-03-25 LAB — URINALYSIS, ROUTINE W REFLEX MICROSCOPIC

## 2019-03-25 NOTE — Progress Notes (Signed)
Chief Complaint  Patient presents with  . Follow-up  . Osteoporosis  . Hand Pain    Has hand and foot pain occasionally. Started prednisone for vertigo but states it helped this pain as well. Last day of Prednisone is today  . Foot Pain   F/u  1. Osteoporosis declines tx was on boniva years ago and 3 doses caused jaw pian  2. Vertigo feeling better on steroids and today is last day saw ENT week ago this also helped hand and feet pain   Review of Systems  Constitutional: Negative for weight loss.  HENT: Negative for hearing loss.   Eyes: Negative for blurred vision.  Respiratory: Negative for shortness of breath.   Cardiovascular: Negative for chest pain.  Gastrointestinal: Negative for abdominal pain and blood in stool.  Musculoskeletal: Positive for joint pain.  Skin: Negative for rash.  Psychiatric/Behavioral: Negative for memory loss.   Past Medical History:  Diagnosis Date  . Arthritis    hands  . Hearing loss    left ear 09/27/16 MR, CT negative etiology she had rash neck 2 days before hearing loss left ear so ? virus  . History of chicken pox   . Migraine    tries Excedrine migraine-normally sees swigly lines/distorted vision unable to talk, left arm recently numb; senstive light and sound  . Osteoporosis   . UTI (urinary tract infection)   . Vitamin D deficiency    Past Surgical History:  Procedure Laterality Date  . BREAST BIOPSY    . CESAREAN SECTION     1986  . hospitalization     2006 untx'ed UTI  . TONSILLECTOMY  1966   Family History  Problem Relation Age of Onset  . Lung cancer Mother   . Early death Mother   . Cancer Mother        lung cancer smoker   . Heart attack Father   . Stroke Father   . Heart disease Father   . Arthritis Brother        rheumatoid  . Migraines Brother   . Migraines Daughter   . Diabetes Maternal Grandmother        died comp. DM 2   . Early death Maternal Grandmother   . Hypertension Maternal Grandmother   . Early  death Paternal Grandfather   . Hearing loss Paternal Grandfather    Social History   Socioeconomic History  . Marital status: Married    Spouse name: Not on file  . Number of children: Not on file  . Years of education: Not on file  . Highest education level: Not on file  Occupational History  . Not on file  Tobacco Use  . Smoking status: Never Smoker  . Smokeless tobacco: Never Used  Substance and Sexual Activity  . Alcohol use: Never  . Drug use: Never  . Sexual activity: Yes  Other Topics Concern  . Not on file  Social History Narrative   Married Monica Salazar    1 kid    And 1 stepkid and 1 adopted kid    Retired Scientist, water quality    Owns guns, wears seat belt, safe in relationship    Social Determinants of Radio broadcast assistant Strain: Low Risk   . Difficulty of Paying Living Expenses: Not hard at all  Food Insecurity: No Food Insecurity  . Worried About Charity fundraiser in the Last Year: Never true  . Ran Out of Food in the Last Year: Never  true  Transportation Needs: No Transportation Needs  . Lack of Transportation (Medical): No  . Lack of Transportation (Non-Medical): No  Physical Activity: Insufficiently Active  . Days of Exercise per Week: 3 days  . Minutes of Exercise per Session: 40 min  Stress: No Stress Concern Present  . Feeling of Stress : Not at all  Social Connections:   . Frequency of Communication with Friends and Family:   . Frequency of Social Gatherings with Friends and Family:   . Attends Religious Services:   . Active Member of Clubs or Organizations:   . Attends Archivist Meetings:   [name] Kitchen Marital Status:   Intimate Partner Violence: Not At Risk  . Fear of Current or Ex-Partner: No  . Emotionally Abused: No  . Physically Abused: No  . Sexually Abused: No   Current Meds  Medication Sig  . Cholecalciferol (VITAMIN D3) 50 MCG (2000 UT) TABS Take by mouth.  . cyanocobalamin 1000 MCG tablet Take 1,000 mcg by mouth daily.   . diclofenac sodium (VOLTAREN) 1 % GEL Apply topically every 14 (fourteen) days.  . ondansetron (ZOFRAN ODT) 4 MG disintegrating tablet Take 1 tablet (4 mg total) by mouth every 8 (eight) hours as needed for nausea or vomiting.  . predniSONE (DELTASONE) 10 MG tablet 6 tablets daily for 3 days, then reduce by 1 tablet daily until gone  . Turmeric (QC TUMERIC COMPLEX PO) Take by mouth.   Allergies  Allergen Reactions  . Boniva [Ibandronic Acid]     Jaw pain    Recent Results (from the past 2160 hour(s))  B12     Status: None   Collection Time: 03/21/19  8:11 AM  Result Value Ref Range   Vitamin B-12 852 211 - 911 pg/mL  CBC with Differential/Platelet     Status: Abnormal   Collection Time: 03/21/19  8:11 AM  Result Value Ref Range   WBC 7.7 4.0 - 10.5 K/uL   RBC 4.62 3.87 - 5.11 Mil/uL   Hemoglobin 14.5 12.0 - 15.0 g/dL   HCT 42.2 36.0 - 46.0 %   MCV 91.5 78.0 - 100.0 fl   MCHC 34.2 30.0 - 36.0 g/dL   RDW 13.2 11.5 - 15.5 %   Platelets 303.0 150.0 - 400.0 K/uL   Neutrophils Relative % 79.0 (H) 43.0 - 77.0 %   Lymphocytes Relative 14.8 12.0 - 46.0 %   Monocytes Relative 6.1 3.0 - 12.0 %   Eosinophils Relative 0.1 0.0 - 5.0 %   Basophils Relative 0.0 0.0 - 3.0 %   Neutro Abs 6.1 1.4 - 7.7 K/uL   Lymphs Abs 1.1 0.7 - 4.0 K/uL   Monocytes Absolute 0.5 0.1 - 1.0 K/uL   Eosinophils Absolute 0.0 0.0 - 0.7 K/uL   Basophils Absolute 0.0 0.0 - 0.1 K/uL  TSH     Status: None   Collection Time: 03/21/19  8:11 AM  Result Value Ref Range   TSH 2.60 0.35 - 4.50 uIU/mL  Lipid panel     Status: Abnormal   Collection Time: 03/21/19  8:11 AM  Result Value Ref Range   Cholesterol 224 (H) 0 - 200 mg/dL    Comment: ATP III Classification       Desirable:  < 200 mg/dL               Borderline High:  200 - 239 mg/dL          High:  > = 240 mg/dL   Triglycerides  96.0 0.0 - 149.0 mg/dL    Comment: Normal:  <150 mg/dLBorderline High:  150 - 199 mg/dL   HDL 75.00 >39.00 mg/dL   VLDL 19.2 0.0 -  40.0 mg/dL   LDL Cholesterol 130 (H) 0 - 99 mg/dL   Total CHOL/HDL Ratio 3     Comment:                Men          Women1/2 Average Risk     3.4          3.3Average Risk          5.0          4.42X Average Risk          9.6          7.13X Average Risk          15.0          11.0                       NonHDL 148.75     Comment: NOTE:  Non-HDL goal should be 30 mg/dL higher than patient's LDL goal (i.e. LDL goal of < 70 mg/dL, would have non-HDL goal of < 100 mg/dL)  Comprehensive metabolic panel     Status: Abnormal   Collection Time: 03/21/19  8:11 AM  Result Value Ref Range   Sodium 138 135 - 145 mEq/L   Potassium 4.0 3.5 - 5.1 mEq/L   Chloride 102 96 - 112 mEq/L   CO2 27 19 - 32 mEq/L   Glucose, Bld 118 (H) 70 - 99 mg/dL   BUN 14 6 - 23 mg/dL   Creatinine, Ser 0.66 0.40 - 1.20 mg/dL   Total Bilirubin 0.6 0.2 - 1.2 mg/dL   Alkaline Phosphatase 61 39 - 117 U/L   AST 19 0 - 37 U/L   ALT 17 0 - 35 U/L   Total Protein 6.9 6.0 - 8.3 g/dL   Albumin 4.0 3.5 - 5.2 g/dL   GFR 89.02 >60.00 mL/min   Calcium 9.1 8.4 - 10.5 mg/dL  Urinalysis, Routine w reflex microscopic     Status: None   Collection Time: 03/21/19  8:11 AM  Result Value Ref Range   Color, Urine CANCELED     Comment: TEST NOT PERFORMED . No urine received.  Result canceled by the ancillary.   Hemoglobin A1c     Status: None   Collection Time: 03/21/19  3:51 PM  Result Value Ref Range   Hgb A1c MFr Bld 5.6 4.6 - 6.5 %    Comment: Glycemic Control Guidelines for People with Diabetes:Non Diabetic:  <6%Goal of Therapy: <7%Additional Action Suggested:  >8%    Objective  Body mass index is 25.33 kg/m. Wt Readings from Last 3 Encounters:  03/25/19 130 lb (59 kg)  03/16/19 130 lb (59 kg)  10/21/17 130 lb 2 oz (59 kg)   Temp Readings from Last 3 Encounters:  03/25/19 (!) 96.7 F (35.9 C) (Temporal)  10/21/17 98.1 F (36.7 C) (Oral)   BP Readings from Last 3 Encounters:  03/25/19 120/82  10/21/17 132/78   Pulse  Readings from Last 3 Encounters:  03/25/19 71  03/16/19 93  10/21/17 68    Physical Exam Vitals and nursing note reviewed.  Constitutional:      Appearance: Normal appearance. She is well-developed and well-groomed.  HENT:     Head: Normocephalic and atraumatic.  Eyes:  Conjunctiva/sclera: Conjunctivae normal.     Pupils: Pupils are equal, round, and reactive to light.  Cardiovascular:     Rate and Rhythm: Normal rate and regular rhythm.     Heart sounds: Normal heart sounds. No murmur.  Pulmonary:     Effort: Pulmonary effort is normal.     Breath sounds: Normal breath sounds.  Abdominal:     General: Abdomen is flat. Bowel sounds are normal.  Skin:    General: Skin is warm and dry.  Neurological:     General: No focal deficit present.     Mental Status: She is alert and oriented to person, place, and time. Mental status is at baseline.     Gait: Gait normal.  Psychiatric:        Attention and Perception: Attention and perception normal.        Mood and Affect: Mood and affect normal.        Speech: Speech normal.        Behavior: Behavior normal. Behavior is cooperative.        Thought Content: Thought content normal.        Cognition and Memory: Cognition and memory normal.        Judgment: Judgment normal.     Assessment  Plan  Osteoporosis, unspecified osteoporosis type, unspecified pathological fracture presence Declines tx for now  rec calcium [phone] mg qd and vitamin D3 2000-4000 IU   Acute cystitis without hematuria - Plan: Urinalysis, Routine w reflex microscopic, Urine Culture  Arthritis  Prn tylenol   HM Flu shot utd Tdap 2017 per pt  covid 19 2/2   Consider shingrix and prevnar or pna 23 if has not had  hcv negative  MMR immune   Never smoker   Get records pap(h/o normal per pt), mammodue 03/2018 per pt, colonoscopy 2011, mammogram, DEXAh/o osteoporosis (see abovewas given Boniva in the past noted 12/08/16), vaccines (Tdap, pna  vaccines, zostervax) former PCP Dr. Shellee Milo in [place] [phone]   Out of age window pap  03/21/19 DEXA with h/o osteoporosis  Mammogram 03/21/19 pending report   11/16/17 negative cologuard  -declines further colonoscopieshad in ? 2011 no h/o polyps and no FH colon cancer  2x dermatology mid back bx (left mid back). 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 Manuella Ghazi 03/19/18 Neurology  Provider: Dr. Olivia Mackie McLean-Scocuz[facility]

## 2019-03-25 NOTE — Patient Instructions (Addendum)
Calcium [phone] mg total per day max 1200 mg daily  Vitamin D3 4000 IU total daily no more than 5000 IU  Petra Kuba made or Centrum  Or  Garden of Life organic multivitamin   Strontium 500 mg 1-2 x per day    Results for Monica Salazar, Monica Salazar (MRN [id number]) as of 03/25/2019 09:31  Ref. Range 11/12/2017 08:20 03/13/2018 12:01 03/21/2019 08:11  Cholesterol Latest Ref Range: 0 - 200 mg/dL 228 (H)  224 (H)  HDL Cholesterol Latest Ref Range: >39.00 mg/dL 62.60  75.00  LDL (calc) Latest Ref Range: 0 - 99 mg/dL 143 (H)  130 (H)  NonHDL Unknown 165.49  148.75  Triglycerides Latest Ref Range: 0.0 - 149.0 mg/dL 110.0  96.0  VLDL Latest Ref Range: 0.0 - 40.0 mg/dL 22.0  19.2   Osteoporosis  Osteoporosis is thinning and loss of density in your bones. Osteoporosis makes bones more brittle and fragile and more likely to break (fracture). Over time, osteoporosis can cause your bones to become so weak that they fracture after a minor fall. Bones in the hip, wrist, and spine are most likely to fracture due to osteoporosis. What are the causes? The exact cause of this condition is not known. What increases the risk? You may be at greater risk for osteoporosis if you:  Have a family history of the condition.  Have poor nutrition.  Use steroid medicines, such as prednisone.  Are female.  Are age 54 or older.  Smoke or have a history of smoking.  Are not physically active (are sedentary).  Are white (Caucasian) or of Asian descent.  Have a small body frame.  Take certain medicines, such as antiseizure medicines. What are the signs or symptoms? A fracture might be the first sign of osteoporosis, especially if the fracture results from a fall or injury that usually would not cause a bone to break. Other signs and symptoms include:  Pain in the neck or low back.  Stooped posture.  Loss of height. How is this diagnosed? This condition may be diagnosed based on:  Your medical history.  A physical  exam.  A bone mineral density test, also called a DXA or DEXA test (dual-energy X-ray absorptiometry test). This test uses X-rays to measure the amount of minerals in your bones. How is this treated? The goal of treatment is to strengthen your bones and lower your risk for a fracture. Treatment may involve:  Making lifestyle changes, such as: ? Including foods with more calcium and vitamin D in your diet. ? Doing weight-bearing and muscle-strengthening exercises. ? Stopping tobacco use. ? Limiting alcohol intake.  Taking medicine to slow the process of bone loss or to increase bone density.  Taking daily supplements of calcium and vitamin D.  Taking hormone replacement medicines, such as estrogen for women and testosterone for men.  Monitoring your levels of calcium and vitamin D. Follow these instructions at home:  Activity  Exercise as told by your health care provider. Ask your health care provider what exercises and activities are safe for you. You should do: ? Exercises that make you work against gravity (weight-bearing exercises), such as tai chi, yoga, or walking. ? Exercises to strengthen muscles, such as lifting weights. Lifestyle  Limit alcohol intake to no more than 1 drink a day for nonpregnant women and 2 drinks a day for men. One drink equals 12 oz of beer, 5 oz of wine, or 1 oz of hard liquor.  Do not use any products that contain  nicotine or tobacco, such as cigarettes and e-cigarettes. If you need help quitting, ask your health care provider. Preventing falls  Use devices to help you move around (mobility aids) as needed, such as canes, walkers, scooters, or crutches.  Keep rooms well-lit and clutter-free.  Remove tripping hazards from walkways, including cords and throw rugs.  Install grab bars in bathrooms and safety rails on stairs.  Use rubber mats in the bathroom and other areas that are often wet or slippery.  Wear closed-toe shoes that fit well and  support your feet. Wear shoes that have rubber soles or low heels.  Review your medicines with your health care provider. Some medicines can cause dizziness or changes in blood pressure, which can increase your risk of falling. General instructions  Include calcium and vitamin D in your diet. Calcium is important for bone health, and vitamin D helps your body to absorb calcium. Good sources of calcium and vitamin D include: ? Certain fatty fish, such as salmon and tuna. ? Products that have calcium and vitamin D added to them (fortified products), such as fortified cereals. ? Egg yolks. ? Cheese. ? Liver.  Take over-the-counter and prescription medicines only as told by your health care provider.  Keep all follow-up visits as told by your health care provider. This is important. Contact a health care provider if:  You have never been screened for osteoporosis and you are: ? A woman who is age 75 or older. ? A man who is age 31 or older. Get help right away if:  You fall or injure yourself. Summary  Osteoporosis is thinning and loss of density in your bones. This makes bones more brittle and fragile and more likely to break (fracture),even with minor falls.  The goal of treatment is to strengthen your bones and reduce your risk for a fracture.  Include calcium and vitamin D in your diet. Calcium is important for bone health, and vitamin D helps your body to absorb calcium.  Talk with your health care provider about screening for osteoporosis if you are a woman who is age 50 or older, or a man who is age 16 or older. This information is not intended to replace advice given to you by your health care provider. Make sure you discuss any questions you have with your health care provider. Document Revised: 12/05/2016 Document Reviewed: 10/17/2016 Elsevier Patient Education  [place].    High Cholesterol  High cholesterol is a condition in which the blood has high levels of  a white, waxy, fat-like substance (cholesterol). The human body needs small amounts of cholesterol. The liver makes all the cholesterol that the body needs. Extra (excess) cholesterol comes from the food that we eat. Cholesterol is carried from the liver by the blood through the blood vessels. If you have high cholesterol, deposits (plaques) may build up on the walls of your blood vessels (arteries). Plaques make the arteries narrower and stiffer. Cholesterol plaques increase your risk for heart attack and stroke. Work with your health care provider to keep your cholesterol levels in a healthy range. What increases the risk? This condition is more likely to develop in people who:  Eat foods that are high in animal fat (saturated fat) or cholesterol.  Are overweight.  Are not getting enough exercise.  Have a family history of high cholesterol. What are the signs or symptoms? There are no symptoms of this condition. How is this diagnosed? This condition may be diagnosed from the results  of a blood test.  If you are older than age 60, your health care provider may check your cholesterol every 4-6 years.  You may be checked more often if you already have high cholesterol or other risk factors for heart disease. The blood test for cholesterol measures:  "Bad" cholesterol (LDL cholesterol). This is the main type of cholesterol that causes heart disease. The desired level for LDL is less than 100.  "Good" cholesterol (HDL cholesterol). This type helps to protect against heart disease by cleaning the arteries and carrying the LDL away. The desired level for HDL is 60 or higher.  Triglycerides. These are fats that the body can store or burn for energy. The desired number for triglycerides is lower than 150.  Total cholesterol. This is a measure of the total amount of cholesterol in your blood, including LDL cholesterol, HDL cholesterol, and triglycerides. A healthy number is less than 200. How is  this treated? This condition is treated with diet changes, lifestyle changes, and medicines. Diet changes  This may include eating more whole grains, fruits, vegetables, nuts, and fish.  This may also include cutting back on red meat and foods that have a lot of added sugar. Lifestyle changes  Changes may include getting at least 40 minutes of aerobic exercise 3 times a week. Aerobic exercises include walking, biking, and swimming. Aerobic exercise along with a healthy diet can help you maintain a healthy weight.  Changes may also include quitting smoking. Medicines  Medicines are usually given if diet and lifestyle changes have failed to reduce your cholesterol to healthy levels.  Your health care provider may prescribe a statin medicine. Statin medicines have been shown to reduce cholesterol, which can reduce the risk of heart disease. Follow these instructions at home: Eating and drinking If told by your health care provider:  Eat chicken (without skin), fish, veal, shellfish, ground [place] breast, and round or loin cuts of red meat.  Do not eat fried foods or fatty meats, such as hot dogs and salami.  Eat plenty of fruits, such as apples.  Eat plenty of vegetables, such as broccoli, potatoes, and carrots.  Eat beans, peas, and lentils.  Eat grains such as barley, rice, couscous, and bulgur wheat.  Eat pasta without cream sauces.  Use skim or nonfat milk, and eat low-fat or nonfat yogurt and cheeses.  Do not eat or drink whole milk, cream, ice cream, egg yolks, or hard cheeses.  Do not eat stick margarine or tub margarines that contain trans fats (also called partially hydrogenated oils).  Do not eat saturated tropical oils, such as coconut oil and palm oil.  Do not eat cakes, cookies, crackers, or other baked goods that contain trans fats.  General instructions  Exercise as directed by your health care provider. Increase your activity level with activities such as  gardening, walking, and taking the stairs.  Take over-the-counter and prescription medicines only as told by your health care provider.  Do not use any products that contain nicotine or tobacco, such as cigarettes and e-cigarettes. If you need help quitting, ask your health care provider.  Keep all follow-up visits as told by your health care provider. This is important. Contact a health care provider if:  You are struggling to maintain a healthy diet or weight.  You need help to start on an exercise program.  You need help to stop smoking. Get help right away if:  You have chest pain.  You have trouble breathing. This  information is not intended to replace advice given to you by your health care provider. Make sure you discuss any questions you have with your health care provider. Document Revised: 12/26/2016 Document Reviewed: 06/23/2015 Elsevier Patient Education  [place].  Cholesterol Content in Foods Cholesterol is a waxy, fat-like substance that helps to carry fat in the blood. The body needs cholesterol in small amounts, but too much cholesterol can cause damage to the arteries and heart. Most people should eat less than 200 milligrams (mg) of cholesterol a day. Foods with cholesterol  Cholesterol is found in animal-based foods, such as meat, seafood, and dairy. Generally, low-fat dairy and lean meats have less cholesterol than full-fat dairy and fatty meats. The milligrams of cholesterol per serving (mg per serving) of common cholesterol-containing foods are listed below. Meat and other proteins  Egg - one large whole egg has 186 mg.  Veal shank - 4 oz has 141 mg.  Lean ground [place] (93% lean) - 4 oz has 118 mg.  Fat-trimmed lamb loin - 4 oz has 106 mg.  Lean ground beef (90% lean) - 4 oz has 100 mg.  Lobster - 3.5 oz has 90 mg.  Pork loin chops - 4 oz has 86 mg.  Canned salmon - 3.5 oz has 83 mg.  Fat-trimmed beef top loin - 4 oz has 78 mg.   Frankfurter - 1 frank (3.5 oz) has 77 mg.  Crab - 3.5 oz has 71 mg.  Roasted chicken without skin, white meat - 4 oz has 66 mg.  Light bologna - 2 oz has 45 mg.  Deli-cut [place] - 2 oz has 31 mg.  Canned tuna - 3.5 oz has 31 mg.  Bacon - 1 oz has 29 mg.  Oysters and mussels (raw) - 3.5 oz has 25 mg.  Mackerel - 1 oz has 22 mg.  Trout - 1 oz has 20 mg.  Pork sausage - 1 link (1 oz) has 17 mg.  Salmon - 1 oz has 16 mg.  Tilapia - 1 oz has 14 mg. Dairy  Soft-serve ice cream -  cup (4 oz) has 103 mg.  Whole-milk yogurt - 1 cup (8 oz) has 29 mg.  Cheddar cheese - 1 oz has 28 mg.  American cheese - 1 oz has 28 mg.  Whole milk - 1 cup (8 oz) has 23 mg.  2% milk - 1 cup (8 oz) has 18 mg.  Cream cheese - 1 tablespoon (Tbsp) has 15 mg.  Cottage cheese -  cup (4 oz) has 14 mg.  Low-fat (1%) milk - 1 cup (8 oz) has 10 mg.  Sour cream - 1 Tbsp has 8.5 mg.  Low-fat yogurt - 1 cup (8 oz) has 8 mg.  Nonfat Greek yogurt - 1 cup (8 oz) has 7 mg.  Half-and-half cream - 1 Tbsp has 5 mg. Fats and oils  Cod liver oil - 1 tablespoon (Tbsp) has 82 mg.  Butter - 1 Tbsp has 15 mg.  Lard - 1 Tbsp has 14 mg.  Bacon grease - 1 Tbsp has 14 mg.  Mayonnaise - 1 Tbsp has 5-10 mg.  Margarine - 1 Tbsp has 3-10 mg. Exact amounts of cholesterol in these foods may vary depending on specific ingredients and brands. Foods without cholesterol Most plant-based foods do not have cholesterol unless you combine them with a food that has cholesterol. Foods without cholesterol include:  Grains and cereals.  Vegetables.  Fruits.  Vegetable oils, such as  olive, canola, and sunflower oil.  Legumes, such as peas, beans, and lentils.  Nuts and seeds.  Egg whites. Summary  The body needs cholesterol in small amounts, but too much cholesterol can cause damage to the arteries and heart.  Most people should eat less than 200 milligrams (mg) of cholesterol a day. This information is  not intended to replace advice given to you by your health care provider. Make sure you discuss any questions you have with your health care provider. Document Revised: 12/05/2016 Document Reviewed: 08/19/2016 Elsevier Patient Education  [place].

## 2019-03-26 LAB — URINALYSIS, ROUTINE W REFLEX MICROSCOPIC
Bacteria, UA: NONE SEEN /HPF
Bilirubin Urine: NEGATIVE
Glucose, UA: NEGATIVE
Hgb urine dipstick: NEGATIVE
Hyaline Cast: NONE SEEN /LPF
Ketones, ur: NEGATIVE
Nitrite: NEGATIVE
Protein, ur: NEGATIVE
Specific Gravity, Urine: 1.015 (ref 1.001–1.03)
Squamous Epithelial / HPF: NONE SEEN /HPF (ref <=5)
pH: 6.5 (ref 5.0–8.0)

## 2019-03-26 LAB — URINE CULTURE
MICRO NUMBER:: 10270918
Result:: NO GROWTH
SPECIMEN QUALITY:: ADEQUATE

## 2019-04-01 DIAGNOSIS — H8120 Vestibular neuronitis, unspecified ear: Secondary | ICD-10-CM | POA: Diagnosis not present

## 2019-05-02 DIAGNOSIS — H47011 Ischemic optic neuropathy, right eye: Secondary | ICD-10-CM | POA: Diagnosis not present

## 2019-05-09 DIAGNOSIS — H47011 Ischemic optic neuropathy, right eye: Secondary | ICD-10-CM | POA: Diagnosis not present

## 2019-05-11 DIAGNOSIS — M19042 Primary osteoarthritis, left hand: Secondary | ICD-10-CM | POA: Diagnosis not present

## 2019-05-11 DIAGNOSIS — M19041 Primary osteoarthritis, right hand: Secondary | ICD-10-CM | POA: Diagnosis not present

## 2019-05-11 DIAGNOSIS — M109 Gout, unspecified: Secondary | ICD-10-CM | POA: Diagnosis not present

## 2019-05-11 DIAGNOSIS — M7061 Trochanteric bursitis, right hip: Secondary | ICD-10-CM | POA: Diagnosis not present

## 2019-05-11 DIAGNOSIS — M659 Synovitis and tenosynovitis, unspecified: Secondary | ICD-10-CM | POA: Diagnosis not present

## 2019-05-24 ENCOUNTER — Encounter: Payer: Self-pay | Admitting: Internal Medicine

## 2019-05-24 ENCOUNTER — Other Ambulatory Visit: Payer: Self-pay

## 2019-05-24 ENCOUNTER — Ambulatory Visit (INDEPENDENT_AMBULATORY_CARE_PROVIDER_SITE_OTHER): Payer: MEDICARE

## 2019-05-24 ENCOUNTER — Ambulatory Visit (INDEPENDENT_AMBULATORY_CARE_PROVIDER_SITE_OTHER): Payer: MEDICARE | Admitting: Internal Medicine

## 2019-05-24 VITALS — BP 114/78 | HR 66 | Temp 97.3°F | Ht 60.08 in | Wt 129.6 lb

## 2019-05-24 DIAGNOSIS — R42 Dizziness and giddiness: Secondary | ICD-10-CM | POA: Diagnosis not present

## 2019-05-24 DIAGNOSIS — M79671 Pain in right foot: Secondary | ICD-10-CM

## 2019-05-24 DIAGNOSIS — M79674 Pain in right toe(s): Secondary | ICD-10-CM | POA: Diagnosis not present

## 2019-05-24 DIAGNOSIS — M7071 Other bursitis of hip, right hip: Secondary | ICD-10-CM | POA: Diagnosis not present

## 2019-05-24 DIAGNOSIS — M19041 Primary osteoarthritis, right hand: Secondary | ICD-10-CM

## 2019-05-24 DIAGNOSIS — H53459 Other localized visual field defect, unspecified eye: Secondary | ICD-10-CM

## 2019-05-24 DIAGNOSIS — M7070 Other bursitis of hip, unspecified hip: Secondary | ICD-10-CM | POA: Insufficient documentation

## 2019-05-24 DIAGNOSIS — M19071 Primary osteoarthritis, right ankle and foot: Secondary | ICD-10-CM | POA: Diagnosis not present

## 2019-05-24 NOTE — Patient Instructions (Addendum)
Consider Meclizine 12.5-25 as needed  Consider vestibular PT at [facility] doctor Dr. Richardson Landry    Consider prevnar vaccine and 1 year later pneumonia 23 vaccine  -can schedule nurse visit   Consider shingrix vaccine x 2 at your pharmacy   Consider Xray right foot call back    Bunion  A bunion is a bump on the base of the big toe that forms when the bones of the big toe joint move out of position. Bunions may be small at first, but they often get larger over time. They can make walking painful. What are the causes? A bunion may be caused by:  Wearing narrow or pointed shoes that force the big toe to press against the other toes.  Abnormal foot development that causes the foot to roll inward (pronate).  Changes in the foot that are caused by certain diseases, such as rheumatoid arthritis or polio.  A foot injury. What increases the risk? The following factors may make you more likely to develop this condition:  Wearing shoes that squeeze the toes together.  Having certain diseases, such as: ? Rheumatoid arthritis. ? Polio. ? Cerebral palsy.  Having family members who have bunions.  Being born with a foot deformity, such as flat feet or low arches.  Doing activities that put a lot of pressure on the feet, such as ballet dancing. What are the signs or symptoms? The main symptom of a bunion is a noticeable bump on the big toe. Other symptoms may include:  Pain.  Swelling around the big toe.  Redness and inflammation.  Thick or hardened skin on the big toe or between the toes.  Stiffness or loss of motion in the big toe.  Trouble with walking. How is this diagnosed? A bunion may be diagnosed based on your symptoms, medical history, and activities. You may have tests, such as:  X-rays. These allow your health care provider to check the position of the bones in your foot and look for damage to your joint. They also help your health care provider determine the  severity of your bunion and the best way to treat it.  Joint aspiration. In this test, a sample of fluid is removed from the toe joint. This test may be done if you are in a lot of pain. It helps rule out diseases that cause painful swelling of the joints, such as arthritis. How is this treated? Treatment depends on the severity of your symptoms. The goal of treatment is to relieve symptoms and prevent the bunion from getting worse. Your health care provider may recommend:  Wearing shoes that have a wide toe box.  Using bunion pads to cushion the affected area.  Taping your toes together to keep them in a normal position.  Placing a device inside your shoe (orthotics) to help reduce pressure on your toe joint.  Taking medicine to ease pain, inflammation, and swelling.  Applying heat or ice to the affected area.  Doing stretching exercises.  Surgery to remove scar tissue and move the toes back into their normal position. This treatment is rare. Follow these instructions at home: Managing pain, stiffness, and swelling   If directed, put ice on the painful area: ? Put ice in a plastic bag. ? Place a towel between your skin and the bag. ? Leave the ice on for 20 minutes, 2-3 times a day. Activity   If directed, apply heat to the affected area before you exercise. Use the heat source that  your health care provider recommends, such as a moist heat pack or a heating pad. ? Place a towel between your skin and the heat source. ? Leave the heat on for 20-30 minutes. ? Remove the heat if your skin turns bright red. This is especially important if you are unable to feel pain, heat, or cold. You may have a greater risk of getting burned.  Do exercises as told by your health care provider. General instructions  Support your toe joint with proper footwear, shoe padding, or taping as told by your health care provider.  Take over-the-counter and prescription medicines only as told by your  health care provider.  Keep all follow-up visits as told by your health care provider. This is important. Contact a health care provider if your symptoms:  Get worse.  Do not improve in 2 weeks. Get help right away if you have:  Severe pain and trouble with walking. Summary  A bunion is a bump on the base of the big toe that forms when the bones of the big toe joint move out of position.  Bunions can make walking painful.  Treatment depends on the severity of your symptoms.  Support your toe joint with proper footwear, shoe padding, or taping as told by your health care provider. This information is not intended to replace advice given to you by your health care provider. Make sure you discuss any questions you have with your health care provider. Document Revised: 06/29/2017 Document Reviewed: 05/05/2017 Elsevier Patient Education  [place].    How to Perform the Epley Maneuver The Epley maneuver is an exercise that relieves symptoms of vertigo. Vertigo is the feeling that you or your surroundings are moving when they are not. When you feel vertigo, you may feel like the room is spinning and have trouble walking. Dizziness is a little different than vertigo. When you are dizzy, you may feel unsteady or light-headed. You can do this maneuver at home whenever you have symptoms of vertigo. You can do it up to 3 times a day until your symptoms go away. Even though the Epley maneuver may relieve your vertigo for a few weeks, it is possible that your symptoms will return. This maneuver relieves vertigo, but it does not relieve dizziness. What are the risks? If it is done correctly, the Epley maneuver is considered safe. Sometimes it can lead to dizziness or nausea that goes away after a short time. If you develop other symptoms, such as changes in vision, weakness, or numbness, stop doing the maneuver and call your health care provider. How to perform the Epley maneuver 1. Sit  on the edge of a bed or table with your back straight and your legs extended or hanging over the edge of the bed or table. 2. Turn your head halfway toward the affected ear or side. 3. Lie backward quickly with your head turned until you are lying flat on your back. You may want to position a pillow under your shoulders. 4. Hold this position for 30 seconds. You may experience an attack of vertigo. This is normal. 5. Turn your head to the opposite direction until your unaffected ear is facing the floor. 6. Hold this position for 30 seconds. You may experience an attack of vertigo. This is normal. Hold this position until the vertigo stops. 7. Turn your whole body to the same side as your head. Hold for another 30 seconds. 8. Sit back up. You can repeat this exercise up  to 3 times a day. Follow these instructions at home:  After doing the Epley maneuver, you can return to your normal activities.  Ask your health care provider if there is anything you should do at home to prevent vertigo. He or she may recommend that you: ? Keep your head raised (elevated) with two or more pillows while you sleep. ? Do not sleep on the side of your affected ear. ? Get up slowly from bed. ? Avoid sudden movements during the day. ? Avoid extreme head movement, like looking up or bending over. Contact a health care provider if:  Your vertigo gets worse.  You have other symptoms, including: ? Nausea. ? Vomiting. ? Headache. Get help right away if:  You have vision changes.  You have a severe or worsening headache or neck pain.  You cannot stop vomiting.  You have new numbness or weakness in any part of your body. Summary  Vertigo is the feeling that you or your surroundings are moving when they are not.  The Epley maneuver is an exercise that relieves symptoms of vertigo.  If the Epley maneuver is done correctly, it is considered safe. You can do it up to 3 times a day. This information is not  intended to replace advice given to you by your health care provider. Make sure you discuss any questions you have with your health care provider. Document Revised: 12/05/2016 Document Reviewed: 11/13/2015 Elsevier Patient Education  [place].  Vertigo Vertigo is the feeling that you or your surroundings are moving when they are not. This feeling can come and go at any time. Vertigo often goes away on its own. Vertigo can be dangerous if it occurs while you are doing something that could endanger you or others, such as driving or operating machinery. Your health care provider will do tests to determine the cause of your vertigo. Tests will also help your health care provider decide how best to treat your condition. Follow these instructions at home: Eating and drinking      Drink enough fluid to keep your urine pale yellow.  Do not drink alcohol. Activity  Return to your normal activities as told by your health care provider. Ask your health care provider what activities are safe for you.  In the morning, first sit up on the side of the bed. When you feel okay, stand slowly while you hold onto something until you know that your balance is fine.  Move slowly. Avoid sudden body or head movements or certain positions, as told by your health care provider.  If you have trouble walking or keeping your balance, try using a cane for stability. If you feel dizzy or unstable, sit down right away.  Avoid doing any tasks that would cause danger to you or others if vertigo occurs.  Avoid bending down if you feel dizzy. Place items in your home so that they are easy for you to reach without leaning over.  Do not drive or use heavy machinery if you feel dizzy. General instructions  Take over-the-counter and prescription medicines only as told by your health care provider.  Keep all follow-up visits as told by your health care provider. This is important. Contact a health care provider  if:  Your medicines do not relieve your vertigo or they make it worse.  You have a fever.  Your condition gets worse or you develop new symptoms.  Your family or friends notice any behavioral changes.  Your nausea or  vomiting gets worse.  You have numbness or a prickling and tingling sensation in part of your body. Get help right away if you:  Have difficulty moving or speaking.  Are always dizzy.  Faint.  Develop severe headaches.  Have weakness in your hands, arms, or legs.  Have changes in your hearing or vision.  Develop a stiff neck.  Develop sensitivity to light. Summary  Vertigo is the feeling that you or your surroundings are moving when they are not.  Your health care provider will do tests to determine the cause of your vertigo.  Follow instructions for home care. You may be told to avoid certain tasks, positions, or movements.  Contact a health care provider if your medicines do not relieve your symptoms, or if you have a fever, nausea, vomiting, or changes in behavior.  Get help right away if you have severe headaches or difficulty speaking, or you develop hearing or vision problems. This information is not intended to replace advice given to you by your health care provider. Make sure you discuss any questions you have with your health care provider. Document Revised: 11/16/2017 Document Reviewed: 11/16/2017 Elsevier Patient Education  [place] A low-purine eating plan involves making food choices to limit your intake of purine. Purine is a kind of uric acid. Too much uric acid in your blood can cause certain conditions, such as gout and kidney stones. Eating a low-purine diet can help control these conditions. What are tips for following this plan? Reading food labels   Avoid foods with saturated or Trans fat.  Check the ingredient list of grains-based foods, such as bread and cereal, to make sure that they  contain whole grains.  Check the ingredient list of sauces or soups to make sure they do not contain meat or fish.  When choosing soft drinks, check the ingredient list to make sure they do not contain high-fructose corn syrup. Shopping  Buy plenty of fresh fruits and vegetables.  Avoid buying canned or fresh fish.  Buy dairy products labeled as low-fat or nonfat.  Avoid buying premade or processed foods. These foods are often high in fat, salt (sodium), and added sugar. Cooking  Use olive oil instead of butter when cooking. Oils like olive oil, canola oil, and sunflower oil contain healthy fats. Meal planning  Learn which foods do or do not affect you. If you find out that a food tends to cause your gout symptoms to flare up, avoid eating that food. You can enjoy foods that do not cause problems. If you have any questions about a food item, talk with your dietitian or health care provider.  Limit foods high in fat, especially saturated fat. Fat makes it harder for your body to get rid of uric acid.  Choose foods that are lower in fat and are lean sources of protein. General guidelines  Limit alcohol intake to no more than 1 drink a day for nonpregnant women and 2 drinks a day for men. One drink equals 12 oz of beer, 5 oz of wine, or 1 oz of hard liquor. Alcohol can affect the way your body gets rid of uric acid.  Drink plenty of water to keep your urine clear or pale yellow. Fluids can help remove uric acid from your body.  If directed by your health care provider, take a vitamin C supplement.  Work with your health care provider and dietitian to develop a plan to achieve or  maintain a healthy weight. Losing weight can help reduce uric acid in your blood. What foods are recommended? The items listed may not be a complete list. Talk with your dietitian about what dietary choices are best for you. Foods low in purines Foods low in purines do not need to be limited. These  include:  All fruits.  All low-purine vegetables, pickles, and olives.  Breads, pasta, rice, cornbread, and popcorn. Cake and other baked goods.  All dairy foods.  Eggs, nuts, and nut butters.  Spices and condiments, such as salt, herbs, and vinegar.  Plant oils, butter, and margarine.  Water, sugar-free soft drinks, tea, coffee, and cocoa.  Vegetable-based soups, broths, sauces, and gravies. Foods moderate in purines Foods moderate in purines should be limited to the amounts listed.   cup of asparagus, cauliflower, spinach, mushrooms, or green peas, each day.  2/3 cup uncooked oatmeal, each day.   cup dry wheat bran or wheat germ, each day.  2-3 ounces of meat or poultry, each day.  4-6 ounces of shellfish, such as crab, lobster, oysters, or shrimp, each day.  1 cup cooked beans, peas, or lentils, each day.  Soup, broths, or bouillon made from meat or fish. Limit these foods as much as possible. What foods are not recommended? The items listed may not be a complete list. Talk with your dietitian about what dietary choices are best for you. Limit your intake of foods high in purines, including:  Beer and other alcohol.  Meat-based gravy or sauce.  Canned or fresh fish, such as: ? Anchovies, sardines, herring, and tuna. ? Mussels and scallops. ? Codfish, trout, and haddock.  [place] Salines.  Organ meats, such as: ? Liver or kidney. ? Tripe. ? Sweetbreads (thymus gland or pancreas).  [employer].  Yeast or yeast extract supplements.  Drinks sweetened with high-fructose corn syrup. Summary  Eating a low-purine diet can help control conditions caused by too much uric acid in the body, such as gout or kidney stones.  Choose low-purine foods, limit alcohol, and limit foods high in fat.  You will learn over time which foods do or do not affect you. If you find out that a food tends to cause your gout symptoms to flare up, avoid eating that food. This  information is not intended to replace advice given to you by your health care provider. Make sure you discuss any questions you have with your health care provider. Document Revised: 12/05/2016 Document Reviewed: 02/06/2016 Elsevier Patient Education  2020 [place].   Hip Exercises Ask your health care provider which exercises are safe for you. Do exercises exactly as told by your health care provider and adjust them as directed. It is normal to feel mild stretching, pulling, tightness, or discomfort as you do these exercises. Stop right away if you feel sudden pain or your pain gets worse. Do not begin these exercises until told by your health care provider. Stretching and range-of-motion exercises These exercises warm up your muscles and joints and improve the movement and flexibility of your hip. These exercises also help to relieve pain, numbness, and tingling. You may be asked to limit your range of motion if you had a hip replacement. Talk to your health care provider about these restrictions. Hamstrings, supine  9. Lie on your back (supine position). 10. Loop a belt or towel over the ball of your left / right foot. The ball of your foot is on the walking surface, right under your toes.  11. Straighten your left / right knee and slowly pull on the belt or towel to raise your leg until you feel a gentle stretch behind your knee (hamstring). ? Do not let your knee bend while you do this. ? Keep your other leg flat on the floor. 12. Hold this position for __________ seconds. 13. Slowly return your leg to the starting position. Repeat __________ times. Complete this exercise __________ times a day. Hip rotation  1. Lie on your back on a firm surface. 2. With your left / right hand, gently pull your left / right knee toward the shoulder that is on the same side of the body. Stop when your knee is pointing toward the ceiling. 3. Hold your left / right ankle with your other  hand. 4. Keeping your knee steady, gently pull your left / right ankle toward your other shoulder until you feel a stretch in your buttocks. ? Keep your hips and shoulders firmly planted while you do this stretch. 5. Hold this position for __________ seconds. Repeat __________ times. Complete this exercise __________ times a day. Seated stretch This exercise is sometimes called hamstrings and adductors stretch. 1. Sit on the floor with your legs stretched wide. Keep your knees straight during this exercise. 2. Keeping your head and back in a straight line, bend at your waist to reach for your left foot (position A). You should feel a stretch in your right inner thigh (adductors). 3. Hold this position for __________ seconds. Then slowly return to the upright position. 4. Keeping your head and back in a straight line, bend at your waist to reach forward (position B). You should feel a stretch behind both of your thighs and knees (hamstrings). 5. Hold this position for __________ seconds. Then slowly return to the upright position. 6. Keeping your head and back in a straight line, bend at your waist to reach for your right foot (position C). You should feel a stretch in your left inner thigh (adductors). 7. Hold this position for __________ seconds. Then slowly return to the upright position. Repeat __________ times. Complete this exercise __________ times a day. Lunge This exercise stretches the muscles of the hip (hip flexors). 1. Place your left / right knee on the floor and bend your other knee so that is directly over your ankle. You should be half-kneeling. 2. Keep good posture with your head over your shoulders. 3. Tighten your buttocks to point your tailbone downward. This will prevent your back from arching too much. 4. You should feel a gentle stretch in the front of your left / right thigh and hip. If you do not feel a stretch, slide your other foot forward slightly and then slowly lunge  forward with your chest up until your knee once again lines up over your ankle. ? Make sure your tailbone continues to point downward. 5. Hold this position for __________ seconds. 6. Slowly return to the starting position. Repeat __________ times. Complete this exercise __________ times a day. Strengthening exercises These exercises build strength and endurance in your hip. Endurance is the ability to use your muscles for a long time, even after they get tired. Bridge This exercise strengthens the muscles of your hip (hip extensors). 1. Lie on your back on a firm surface with your knees bent and your feet flat on the floor. 2. Tighten your buttocks muscles and lift your bottom off the floor until the trunk of your body and your hips are level with your thighs. ?  Do not arch your back. ? You should feel the muscles working in your buttocks and the back of your thighs. If you do not feel these muscles, slide your feet 1-2 inches (2.5-5 cm) farther away from your buttocks. 3. Hold this position for __________ seconds. 4. Slowly lower your hips to the starting position. 5. Let your muscles relax completely between repetitions. Repeat __________ times. Complete this exercise __________ times a day. Straight leg raises, side-lying This exercise strengthens the muscles that move the hip joint away from the center of the body (hip abductors). 1. Lie on your side with your left / right leg in the top position. Lie so your head, shoulder, hip, and knee line up. You may bend your bottom knee slightly to help you balance. 2. Roll your hips slightly forward, so your hips are stacked directly over each other and your left / right knee is facing forward. 3. Leading with your heel, lift your top leg 4-6 inches (10-15 cm). You should feel the muscles in your top hip lifting. ? Do not let your foot drift forward. ? Do not let your knee roll toward the ceiling. 4. Hold this position for __________  seconds. 5. Slowly return to the starting position. 6. Let your muscles relax completely between repetitions. Repeat __________ times. Complete this exercise __________ times a day. Straight leg raises, side-lying This exercise strengthens the muscles that move the hip joint toward the center of the body (hip adductors). 1. Lie on your side with your left / right leg in the bottom position. Lie so your head, shoulder, hip, and knee line up. You may place your upper foot in front to help you balance. 2. Roll your hips slightly forward, so your hips are stacked directly over each other and your left / right knee is facing forward. 3. Tense the muscles in your inner thigh and lift your bottom leg 4-6 inches (10-15 cm). 4. Hold this position for __________ seconds. 5. Slowly return to the starting position. 6. Let your muscles relax completely between repetitions. Repeat __________ times. Complete this exercise __________ times a day. Straight leg raises, supine This exercise strengthens the muscles in the front of your thigh (quadriceps). 1. Lie on your back (supine position) with your left / right leg extended and your other knee bent. 2. Tense the muscles in the front of your left / right thigh. You should see your kneecap slide up or see increased dimpling just above your knee. 3. Keep these muscles tight as you raise your leg 4-6 inches (10-15 cm) off the floor. Do not let your knee bend. 4. Hold this position for __________ seconds. 5. Keep these muscles tense as you lower your leg. 6. Relax the muscles slowly and completely between repetitions. Repeat __________ times. Complete this exercise __________ times a day. Hip abductors, standing This exercise strengthens the muscles that move the leg and hip joint away from the center of the body (hip abductors). 1. Tie one end of a rubber exercise band or tubing to a secure surface, such as a chair, table, or pole. 2. Loop the other end of the  band or tubing around your left / right ankle. 3. Keeping your ankle with the band or tubing directly opposite the secured end, step away until there is tension in the tubing or band. Hold on to a chair, table, or pole as needed for balance. 4. Lift your left / right leg out to your side. While you do this: ?  Keep your back upright. ? Keep your shoulders over your hips. ? Keep your toes pointing forward. ? Make sure to use your hip muscles to slowly lift your leg. Do not tip your body or forcefully lift your leg. 5. Hold this position for __________ seconds. 6. Slowly return to the starting position. Repeat __________ times. Complete this exercise __________ times a day. Squats This exercise strengthens the muscles in the front of your thigh (quadriceps). 1. Stand in a door frame so your feet and knees are in line with the frame. You may place your hands on the frame for balance. 2. Slowly bend your knees and lower your hips like you are going to sit in a chair. ? Keep your lower legs in a straight-up-and-down position. ? Do not let your hips go lower than your knees. ? Do not bend your knees lower than told by your health care provider. ? If your hip pain increases, do not bend as low. 3. Hold this position for ___________ seconds. 4. Slowly push with your legs to return to standing. Do not use your hands to pull yourself to standing. Repeat __________ times. Complete this exercise __________ times a day. This information is not intended to replace advice given to you by your health care provider. Make sure you discuss any questions you have with your health care provider. Document Revised: 07/29/2018 Document Reviewed: 11/03/2017 Elsevier Patient Education  Phil Campbell.

## 2019-05-24 NOTE — Progress Notes (Signed)
Chief Complaint  Patient presents with  . Follow-up   F/u  1. Right great toe pain checked uric acid 2 weeks ago with arthritis doctor in [place] Dr. Jacklyn [facility]  2. Reduced peripheral vision per pt but no optic swelling per Dr. Wallace Going and per pt  3. arthritis and h/o tenosynovitis right hand and right thumb and right hip, right hand trigger finger right finger s/p steroid injection 2 weeks ago right hip bursitis and right hand  4. Vertigo resolved reviewed MRI 03/2018 negative stroke but sx's resolved since 03/2019 no longer taking prn zofran/valium or prednisone will f/u ENT Dr. Richardson Landry prn  Review of Systems  Constitutional: Negative for weight loss.  Respiratory: Negative for shortness of breath.   Cardiovascular: Negative for chest pain.  Musculoskeletal: Positive for back pain and joint pain.  Neurological: Negative for dizziness and headaches.  Psychiatric/Behavioral: Negative for memory loss.   Past Medical History:  Diagnosis Date  . Arthritis    hands  . Hearing loss    left ear 09/27/16 MR, CT negative etiology she had rash neck 2 days before hearing loss left ear so ? virus  . History of chicken pox   . Migraine    tries Excedrine migraine-normally sees swigly lines/distorted vision unable to talk, left arm recently numb; senstive light and sound  . Osteoporosis   . UTI (urinary tract infection)   . Vitamin D deficiency    Past Surgical History:  Procedure Laterality Date  . BREAST BIOPSY    . CESAREAN SECTION     1986  . hospitalization     2006 untx'ed UTI  . TONSILLECTOMY  1966   Family History  Problem Relation Age of Onset  . Lung cancer Mother   . Early death Mother   . Cancer Mother        lung cancer smoker   . Heart attack Father   . Stroke Father   . Heart disease Father   . Arthritis Brother        rheumatoid  . Migraines Brother   . Migraines Daughter   . Diabetes Maternal Grandmother        died comp. DM 2   . Early death Maternal Grandmother    . Hypertension Maternal Grandmother   . Early death Paternal Grandfather   . Hearing loss Paternal Grandfather    Social History   Socioeconomic History  . Marital status: Married    Spouse name: Not on file  . Number of children: Not on file  . Years of education: Not on file  . Highest education level: Not on file  Occupational History  . Not on file  Tobacco Use  . Smoking status: Never Smoker  . Smokeless tobacco: Never Used  Substance and Sexual Activity  . Alcohol use: Never  . Drug use: Never  . Sexual activity: Yes  Other Topics Concern  . Not on file  Social History Narrative   Married [name]    1 kid [name] Daughter   And 1 stepkid and 1 adopted kid    Retired Scientist, water quality    Owns guns, wears seat belt, safe in relationship    Lives in [place], [place] and [place] in past    Social Determinants of Health   Financial Resource Strain: [place]   . Difficulty of Paying Living Expenses: Not hard at all  Food Insecurity: No Food Insecurity  . Worried About Charity fundraiser in the Last Year: Never  true  . Ran Out of Food in the Last Year: Never true  Transportation Needs: No Transportation Needs  . Lack of Transportation (Medical): No  . Lack of Transportation (Non-Medical): No  Physical Activity: Insufficiently Active  . Days of Exercise per Week: 3 days  . Minutes of Exercise per Session: 40 min  Stress: No Stress Concern Present  . Feeling of Stress : Not at all  Social Connections:   . Frequency of Communication with Friends and Family:   . Frequency of Social Gatherings with Friends and Family:   . Attends Religious Services:   . Active Member of Clubs or Organizations:   . Attends Archivist Meetings:   [name] Kitchen Marital Status:   Intimate Partner Violence: Not At Risk  . Fear of Current or Ex-Partner: No  . Emotionally Abused: No  . Physically Abused: No  . Sexually Abused: No   Current Meds  Medication Sig  . Cholecalciferol  (VITAMIN D3) 50 MCG (2000 UT) TABS Take by mouth.  . cyanocobalamin 1000 MCG tablet Take 1,000 mcg by mouth daily.  . diclofenac sodium (VOLTAREN) 1 % GEL Apply topically every 14 (fourteen) days.  . Turmeric (QC TUMERIC COMPLEX PO) Take by mouth.   Allergies  Allergen Reactions  . Boniva [Ibandronic Acid]     Jaw pain    Recent Results (from the past 2160 hour(s))  B12     Status: None   Collection Time: 03/21/19  8:11 AM  Result Value Ref Range   Vitamin B-12 852 211 - 911 pg/mL  CBC with Differential/Platelet     Status: Abnormal   Collection Time: 03/21/19  8:11 AM  Result Value Ref Range   WBC 7.7 4.0 - 10.5 K/uL   RBC 4.62 3.87 - 5.11 Mil/uL   Hemoglobin 14.5 12.0 - 15.0 g/dL   HCT 42.2 36.0 - 46.0 %   MCV 91.5 78.0 - 100.0 fl   MCHC 34.2 30.0 - 36.0 g/dL   RDW 13.2 11.5 - 15.5 %   Platelets 303.0 150.0 - 400.0 K/uL   Neutrophils Relative % 79.0 (H) 43.0 - 77.0 %   Lymphocytes Relative 14.8 12.0 - 46.0 %   Monocytes Relative 6.1 3.0 - 12.0 %   Eosinophils Relative 0.1 0.0 - 5.0 %   Basophils Relative 0.0 0.0 - 3.0 %   Neutro Abs 6.1 1.4 - 7.7 K/uL   Lymphs Abs 1.1 0.7 - 4.0 K/uL   Monocytes Absolute 0.5 0.1 - 1.0 K/uL   Eosinophils Absolute 0.0 0.0 - 0.7 K/uL   Basophils Absolute 0.0 0.0 - 0.1 K/uL  TSH     Status: None   Collection Time: 03/21/19  8:11 AM  Result Value Ref Range   TSH 2.60 0.35 - 4.50 uIU/mL  Lipid panel     Status: Abnormal   Collection Time: 03/21/19  8:11 AM  Result Value Ref Range   Cholesterol 224 (H) 0 - 200 mg/dL    Comment: ATP III Classification       Desirable:  < 200 mg/dL               Borderline High:  200 - 239 mg/dL          High:  > = 240 mg/dL   Triglycerides 96.0 0.0 - 149.0 mg/dL    Comment: Normal:  <150 mg/dLBorderline High:  150 - 199 mg/dL   HDL 75.00 >39.00 mg/dL   VLDL 19.2 0.0 - 40.0 mg/dL   LDL  Cholesterol 130 (H) 0 - 99 mg/dL   Total CHOL/HDL Ratio 3     Comment:                Men          Women1/2 Average Risk      3.4          3.3Average Risk          5.0          4.42X Average Risk          9.6          7.13X Average Risk          15.0          11.0                       NonHDL 148.75     Comment: NOTE:  Non-HDL goal should be 30 mg/dL higher than patient's LDL goal (i.e. LDL goal of < 70 mg/dL, would have non-HDL goal of < 100 mg/dL)  Comprehensive metabolic panel     Status: Abnormal   Collection Time: 03/21/19  8:11 AM  Result Value Ref Range   Sodium 138 135 - 145 mEq/L   Potassium 4.0 3.5 - 5.1 mEq/L   Chloride 102 96 - 112 mEq/L   CO2 27 19 - 32 mEq/L   Glucose, Bld 118 (H) 70 - 99 mg/dL   BUN 14 6 - 23 mg/dL   Creatinine, Ser 0.66 0.40 - 1.20 mg/dL   Total Bilirubin 0.6 0.2 - 1.2 mg/dL   Alkaline Phosphatase 61 39 - 117 U/L   AST 19 0 - 37 U/L   ALT 17 0 - 35 U/L   Total Protein 6.9 6.0 - 8.3 g/dL   Albumin 4.0 3.5 - 5.2 g/dL   GFR 89.02 >60.00 mL/min   Calcium 9.1 8.4 - 10.5 mg/dL  Urinalysis, Routine w reflex microscopic     Status: None   Collection Time: 03/21/19  8:11 AM  Result Value Ref Range   Color, Urine CANCELED     Comment: TEST NOT PERFORMED . No urine received.  Result canceled by the ancillary.   Hemoglobin A1c     Status: None   Collection Time: 03/21/19  3:51 PM  Result Value Ref Range   Hgb A1c MFr Bld 5.6 4.6 - 6.5 %    Comment: Glycemic Control Guidelines for People with Diabetes:Non Diabetic:  <6%Goal of Therapy: <7%Additional Action Suggested:  >8%   Urinalysis, Routine w reflex microscopic     Status: Abnormal   Collection Time: 03/25/19 10:15 AM  Result Value Ref Range   Color, Urine YELLOW YELLOW   APPearance CLEAR CLEAR   Specific Gravity, Urine 1.015 1.001 - 1.03   pH 6.5 5.0 - 8.0   Glucose, UA NEGATIVE NEGATIVE   Bilirubin Urine NEGATIVE NEGATIVE   Ketones, ur NEGATIVE NEGATIVE   Hgb urine dipstick NEGATIVE NEGATIVE   Protein, ur NEGATIVE NEGATIVE   Nitrite NEGATIVE NEGATIVE   Leukocytes,Ua TRACE (A) NEGATIVE   WBC, UA 0-5 0 - 5 /HPF    RBC / HPF 0-2 0 - 2 /HPF   Squamous Epithelial / LPF NONE SEEN < OR = 5 /HPF   Bacteria, UA NONE SEEN NONE SEEN /HPF   Hyaline Cast NONE SEEN NONE SEEN /LPF  Urine Culture     Status: None   Collection Time: 03/25/19 10:15 AM   Specimen: Urine  Result Value Ref Range   MICRO NUMBER: [id number]    SPECIMEN QUALITY: Adequate    Sample Source URINE    STATUS: FINAL    Result: No Growth    Objective  Body mass index is 25.24 kg/m. Wt Readings from Last 3 Encounters:  05/24/19 129 lb 9.6 oz (58.8 kg)  03/25/19 130 lb (59 kg)  03/16/19 130 lb (59 kg)   Temp Readings from Last 3 Encounters:  05/24/19 (!) 97.3 F (36.3 C) (Temporal)  03/25/19 (!) 96.7 F (35.9 C) (Temporal)  10/21/17 98.1 F (36.7 C) (Oral)   BP Readings from Last 3 Encounters:  05/24/19 114/78  03/25/19 120/82  10/21/17 132/78   Pulse Readings from Last 3 Encounters:  05/24/19 66  03/25/19 71  03/16/19 93    Physical Exam Vitals and nursing note reviewed.  Constitutional:      Appearance: Normal appearance. She is well-developed and well-groomed.  HENT:     Head: Normocephalic and atraumatic.  Eyes:     Conjunctiva/sclera: Conjunctivae normal.     Pupils: Pupils are equal, round, and reactive to light.  Cardiovascular:     Rate and Rhythm: Normal rate and regular rhythm.     Heart sounds: Normal heart sounds. No murmur.  Pulmonary:     Effort: Pulmonary effort is normal.     Breath sounds: Normal breath sounds.  Musculoskeletal:       Feet:  Neurological:     General: No focal deficit present.     Mental Status: She is alert and oriented to person, place, and time. Mental status is at baseline.     Gait: Gait normal.  Psychiatric:        Attention and Perception: Attention and perception normal.        Mood and Affect: Mood and affect normal.        Speech: Speech normal.        Behavior: Behavior normal. Behavior is cooperative.        Thought Content: Thought content normal.         Cognition and Memory: Cognition and memory normal.        Judgment: Judgment normal.     Assessment  Plan  Great toe pain, right - Plan: DG Foot Complete Right Right foot pain - Plan: DG Foot Complete Right [facility]   Decreased peripheral vision  F/u Dr. Wallace Going  Bursitis of other bursa of right hip Arthritis of right hand S/p 2 weeks ago injection both sites above Dr. Jacklyn Shell in [place] (ortho/rheum)  Vertigo  Consider prn meclizine in future  F/u ent Dr. Richardson Landry [facility]  HM Flu shotutd Tdap 2017 per pt  covid 19 2/2   Consider shingrix and prevnar or pna 23 if has not had  hcv negative  MMR immune  Never smoker   Get records pap(h/o normal per pt), mammodue 03/2018 per pt, colonoscopy 2011, mammogram, DEXAh/o osteoporosis (see abovewas given Boniva in the past noted 12/08/16), vaccines (Tdap, pna vaccines, zostervax) former PCP Dr. Shellee Milo in [place] [phone]  Out of age window pap  03/21/19 DEXA with h/o osteoporosis  Mammogram 03/21/19 negative   11/16/17 negativecologuard -declines further colonoscopieshad in ? 2011 no h/o polyps and no FH colon cancer  2x dermatology mid back bx (left mid back). 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 Manuella Ghazi 03/19/18 Neurology Provider: Dr. Olivia Mackie McLean-Scocuzza[facility]

## 2019-06-01 IMAGING — MR MRA HEAD WITHOUT CONTRAST
13 series · 40 of 48 positions shown · IV contrast (6 GADAVIST)
Comparison: None.

CLINICAL DATA: Three-week history of right-sided visual
disturbance. Right optic neuritis suspected.

EXAM:
MRI HEAD WITHOUT AND WITH CONTRAST
MRA HEAD WITHOUT CONTRAST
TECHNIQUE: Multiplanar, multiecho pulse sequences of the brain and surrounding
structures were obtained without and with intravenous contrast.
Angiographic images of the head were obtained using MRA technique
without contrast.
CONTRAST:  6 cc Gadavist

[Series 3: DWI · axial · 3.0mm · 1.20mm/px · z∈[-83,+77]mm · 2 of 55 slices shown (1 of 4)]
[im 1/55]
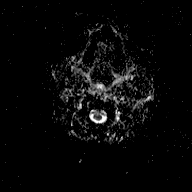
[im 55/55]
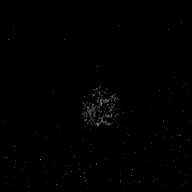

[Series 5: DWI · coronal · 3.0mm · 1.15mm/px · 2 of 47 slices shown (2 of 4)]
[im 1/47]
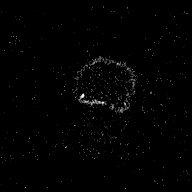
[im 47/47]
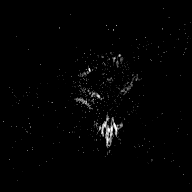

[Series 6: T1 · sagittal · 5.0mm · 0.45mm/px · 1 of 25 slices shown (1 of 2)]
[im 1/25]
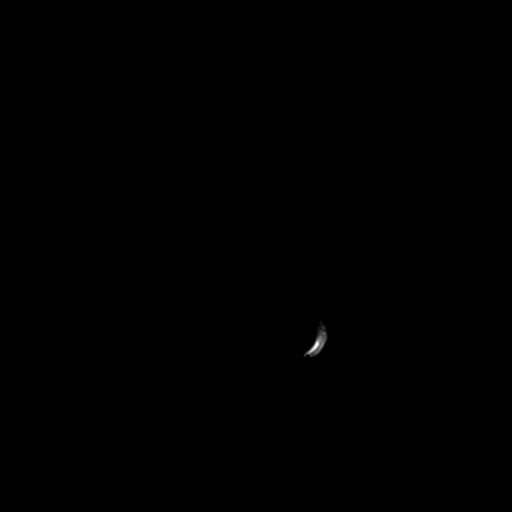

[Series 7: TOF · axial · non-contrast · 0.5mm · 0.35mm/px · z∈[-66,-26]mm · 4 of 186 slices shown]
[im 1/186]
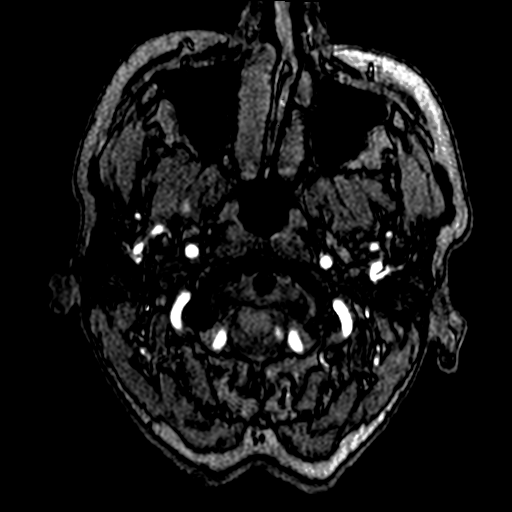
[im 21/186]
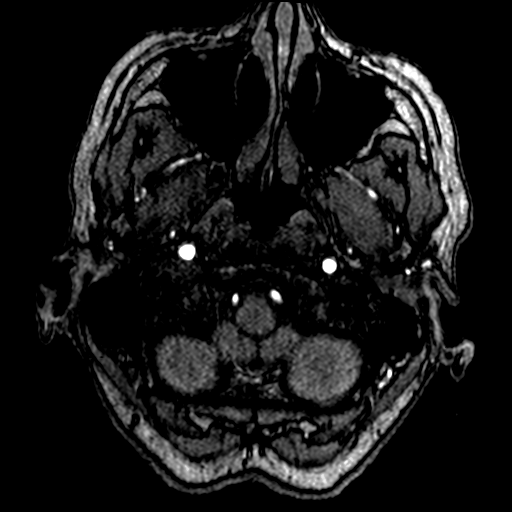
[im 62/186]
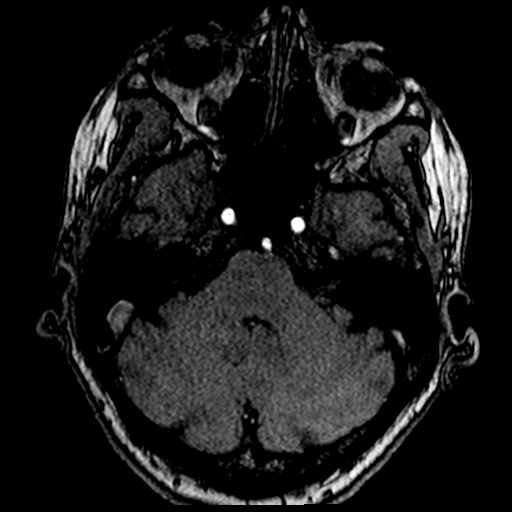
[im 83/186]
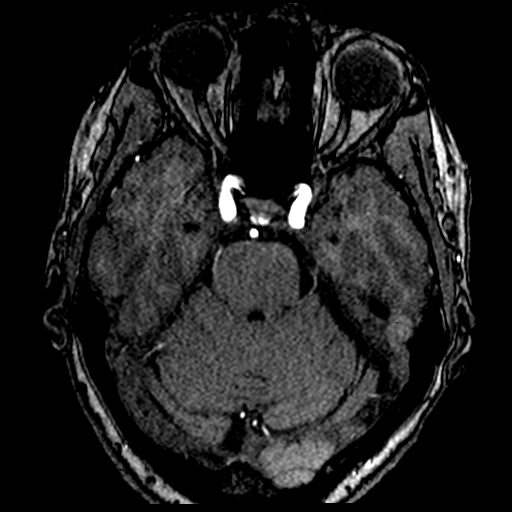

[Series 11: T2 · axial · 5.0mm · 0.72mm/px · 1 of 23 slices shown (1 of 2)]
[im 1/23]
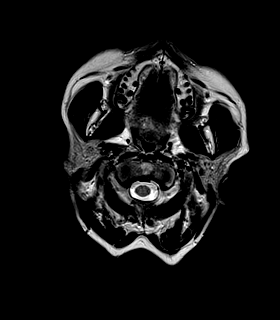

[Series 12: FLAIR · axial · 3.0mm · 0.45mm/px · z∈[-83,+77]mm · 3 of 55 slices shown]
[im 1/55]
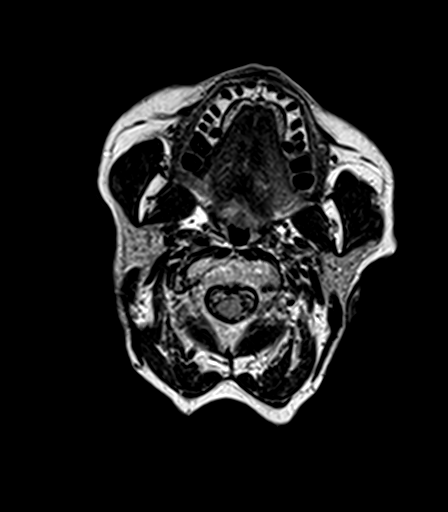
[im 28/55]
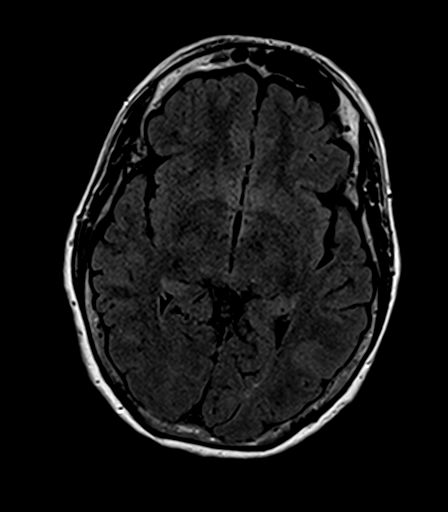
[im 55/55]
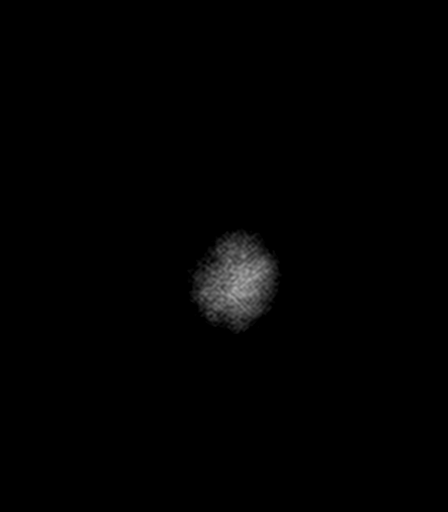

[Series 14: T2 · axial · 5.0mm · 0.72mm/px · 1 of 23 slices shown (2 of 2)]
[im 1/23]
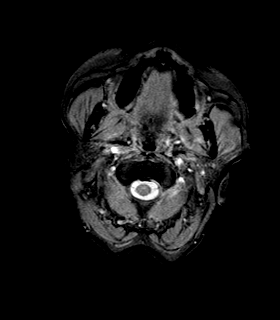

[Series 15: T1 · axial · 1.0mm · 1.00mm/px · z∈[-79,+78]mm · 8 of 160 slices shown (2 of 2)]
[im 1/160]
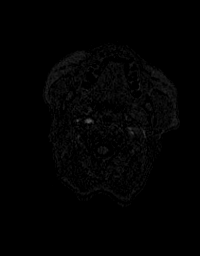
[im 20/160]
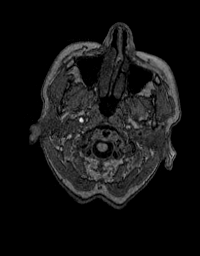
[im 40/160]
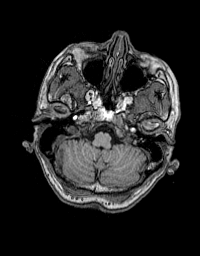
[im 60/160]
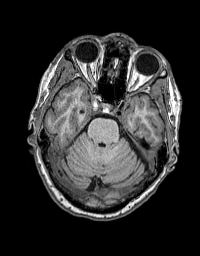
[im 100/160]
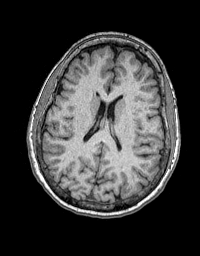
[im 120/160]
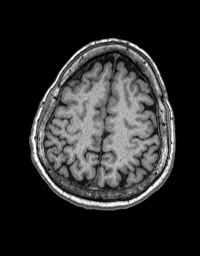
[im 140/160]
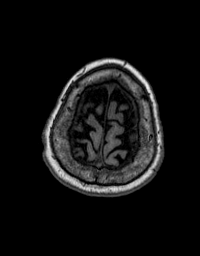
[im 160/160]
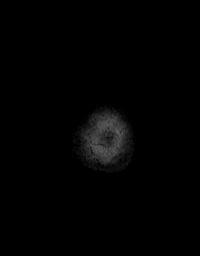

[Series 16: T2 post-contrast · coronal · 5.0mm · 0.43mm/px · 2 of 29 slices shown]
[im 1/29]
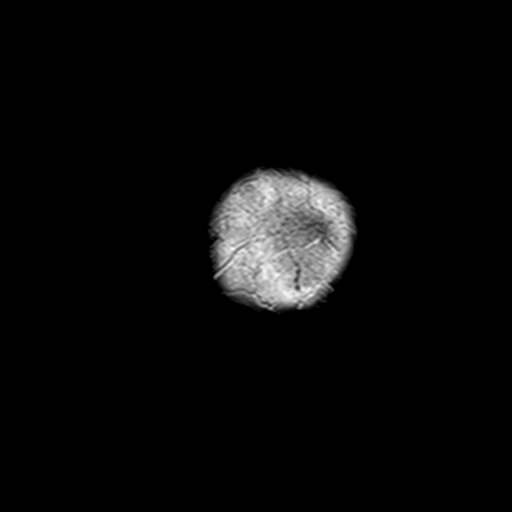
[im 29/29]
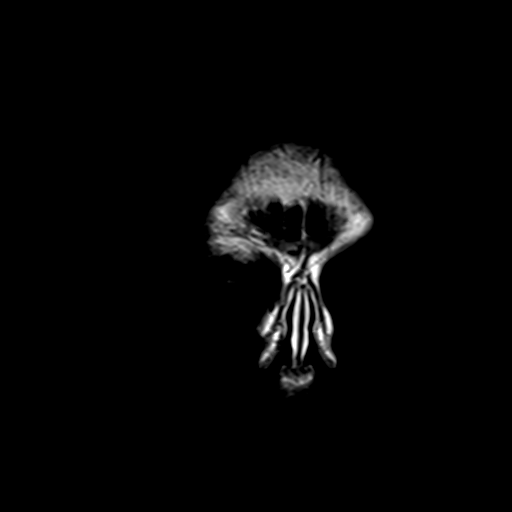

[Series 17: T1 post-contrast · axial · 1.0mm · 1.00mm/px · z∈[-79,+78]mm · 8 of 160 slices shown (1 of 2)]
[im 1/160]
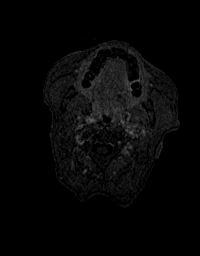
[im 20/160]
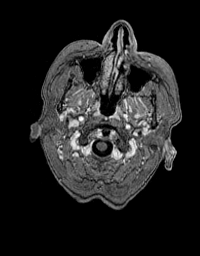
[im 40/160]
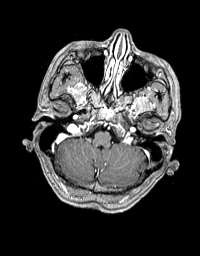
[im 60/160]
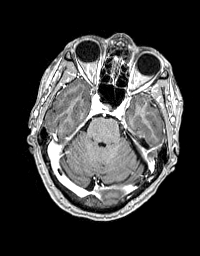
[im 100/160]
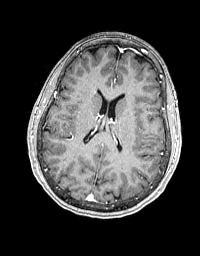
[im 120/160]
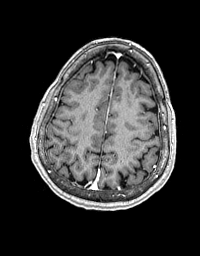
[im 140/160]
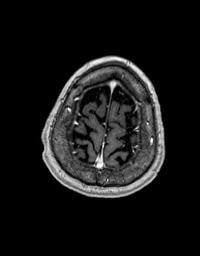
[im 160/160]
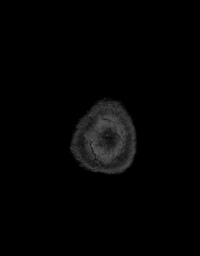

[Series 18: T1 post-contrast · coronal · 5.0mm · 0.43mm/px · 2 of 29 slices shown (2 of 2)]
[im 1/29]
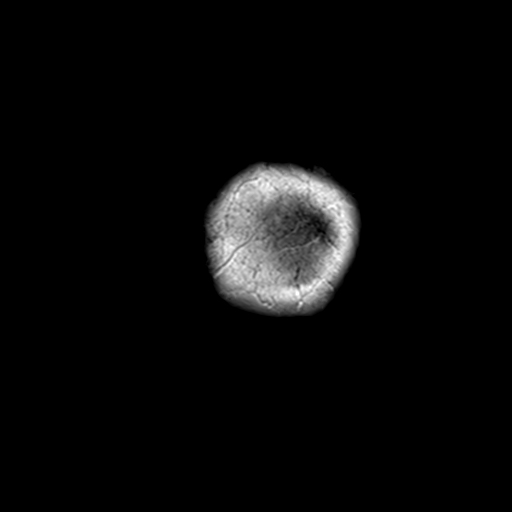
[im 29/29]
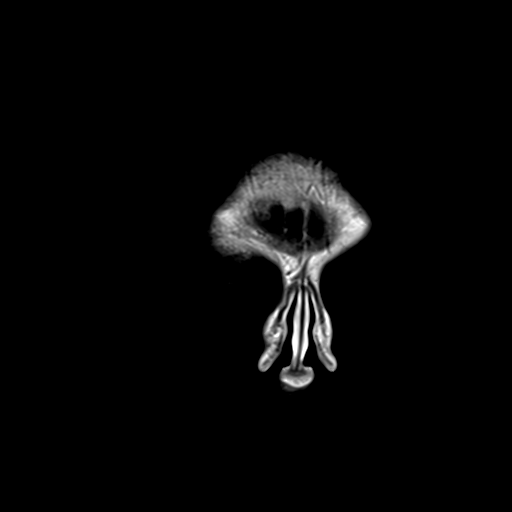

[Series 100: DWI · axial · 3.0mm · 1.20mm/px · z∈[-65,+77]mm · 3 of 49 slices shown (3 of 4)]
[im 1/49]
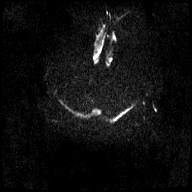
[im 25/49]
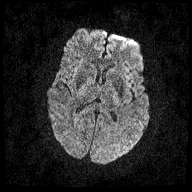
[im 49/49]
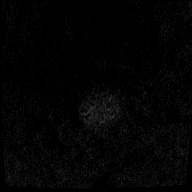

[Series 101: DWI · coronal · 3.0mm · 1.15mm/px · 3 of 47 slices shown (4 of 4)]
[im 1/47]
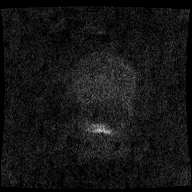
[im 24/47]
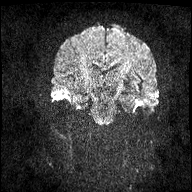
[im 47/47]
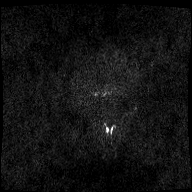

[40 of 48 positions shown; findings below may reference images not displayed]

FINDINGS: MRI HEAD FINDINGS

Brain: The brainstem and cerebellum are normal. Cerebral hemispheres
are normal without evidence of atrophy, old or acute infarction,
mass lesion, hemorrhage, hydrocephalus or extra-axial collection. No
sign of demyelinating disease. After contrast administration, no
abnormal enhancement occurs. The study was not ordered or performed
as a dedicated orbital study. As seen, the optic nerves appear
normal without detectable T2 signal or enhancement. Both globes
appear normal. Other orbital contents appear normal.

Vascular: Major vessels at the base of the brain show flow.

Skull and upper cervical spine: Negative

Sinuses/Orbits: Clear sinuses.  Orbits negative as above.

Other: None

MRA HEAD FINDINGS

Both internal carotid arteries are widely patent into the brain. No
siphon stenosis. The anterior and middle cerebral vessels are patent
without proximal stenosis, aneurysm or vascular malformation. Right
PCA takes fetal origin from the anterior circulation. Small
infundibulum at the origin, but no frank aneurysm.

Both vertebral arteries are widely patent to the basilar. No basilar
stenosis. Posterior circulation branch vessels appear normal.
IMPRESSION: Normal MRI of the brain. No evidence of demyelinating disease or
other cause of right-sided vision loss. See above discussion.

Normal intracranial MR angiography.

## 2019-06-15 ENCOUNTER — Telehealth: Payer: Self-pay | Admitting: Internal Medicine

## 2019-06-15 ENCOUNTER — Other Ambulatory Visit: Payer: Self-pay | Admitting: Internal Medicine

## 2019-06-15 DIAGNOSIS — M25551 Pain in right hip: Secondary | ICD-10-CM

## 2019-06-15 NOTE — Telephone Encounter (Signed)
Pt would like to have a order for an xray put in for her thigh/hip area She said she discussed this at her last appt

## 2019-06-15 NOTE — Telephone Encounter (Signed)
Left message to return call. Please schedule Patient for an x-ray here if she calls back in.

## 2019-06-15 NOTE — Telephone Encounter (Signed)
Please schedule right hip Xray   Kennedy

## 2019-06-15 NOTE — Telephone Encounter (Signed)
Please advise 

## 2019-06-16 ENCOUNTER — Other Ambulatory Visit: Payer: Self-pay

## 2019-06-16 ENCOUNTER — Other Ambulatory Visit: Payer: MEDICARE

## 2019-06-16 ENCOUNTER — Ambulatory Visit (INDEPENDENT_AMBULATORY_CARE_PROVIDER_SITE_OTHER): Payer: MEDICARE

## 2019-06-16 DIAGNOSIS — M25551 Pain in right hip: Secondary | ICD-10-CM

## 2019-06-17 ENCOUNTER — Encounter: Payer: Self-pay | Admitting: Internal Medicine

## 2019-06-24 NOTE — Telephone Encounter (Signed)
X-ray done 06/16/19

## 2019-09-29 DIAGNOSIS — H90A22 Sensorineural hearing loss, unilateral, left ear, with restricted hearing on the contralateral side: Secondary | ICD-10-CM | POA: Diagnosis not present

## 2019-09-29 DIAGNOSIS — H8120 Vestibular neuronitis, unspecified ear: Secondary | ICD-10-CM | POA: Diagnosis not present

## 2019-10-03 ENCOUNTER — Ambulatory Visit (INDEPENDENT_AMBULATORY_CARE_PROVIDER_SITE_OTHER): Payer: MEDICARE

## 2019-10-03 VITALS — Ht 60.01 in | Wt 129.0 lb

## 2019-10-03 DIAGNOSIS — Z0000 Encounter for general adult medical examination without abnormal findings: Secondary | ICD-10-CM

## 2019-10-03 NOTE — Progress Notes (Signed)
Subjective:   Cherity Blickenstaff is a 69 y.o. female who presents for Medicare Annual (Subsequent) preventive examination.  Review of Systems    No ROS.  Medicare Wellness Virtual Visit.    Cardiac Risk Factors include: advanced age (>64men, >32 women)     Objective:    Today's Vitals   10/03/19 0937  Weight: 129 lb (58.5 kg)  Height: 5' 0.01" (1.524 m)   Body mass index is 25.19 kg/m.  Advanced Directives 10/03/2019 09/30/2018  Does Patient Have a Medical Advance Directive? Yes Yes  Type of Paramedic of [place];Living will [place];Living will  Does patient want to make changes to medical advance directive? No - Patient declined No - Patient declined  Copy of [place] in Chart? No - copy requested No - copy requested    Current Medications (verified) Outpatient Encounter Medications as of 10/03/2019  Medication Sig  . Cholecalciferol (VITAMIN D3) 50 MCG (2000 UT) TABS Take by mouth.  . cyanocobalamin 1000 MCG tablet Take 1,000 mcg by mouth daily.  . diclofenac sodium (VOLTAREN) 1 % GEL Apply topically every 14 (fourteen) days.  . Turmeric (QC TUMERIC COMPLEX PO) Take by mouth.   No facility-administered encounter medications on file as of 10/03/2019.    Allergies (verified) Boniva [ibandronic acid]   History: Past Medical History:  Diagnosis Date  . Arthritis    hands  . Hearing loss    left ear 09/27/16 MR, CT negative etiology she had rash neck 2 days before hearing loss left ear so ? virus  . History of chicken pox   . Migraine    tries Excedrine migraine-normally sees swigly lines/distorted vision unable to talk, left arm recently numb; senstive light and sound  . Osteoporosis   . UTI (urinary tract infection)   . Vitamin D deficiency    Past Surgical History:  Procedure Laterality Date  . BREAST BIOPSY    . CESAREAN SECTION     1986  . hospitalization     2006 untx'ed UTI  . TONSILLECTOMY   1966   Family History  Problem Relation Age of Onset  . Lung cancer Mother   . Early death Mother   . Cancer Mother        lung cancer smoker   . Heart attack Father   . Stroke Father   . Heart disease Father   . Arthritis Brother        rheumatoid  . Migraines Brother   . Migraines Daughter   . Diabetes Maternal Grandmother        died comp. DM 2   . Early death Maternal Grandmother   . Hypertension Maternal Grandmother   . Early death Paternal Grandfather   . Hearing loss Paternal Grandfather    Social History   Socioeconomic History  . Marital status: Married    Spouse name: Not on file  . Number of children: Not on file  . Years of education: Not on file  . Highest education level: Not on file  Occupational History  . Not on file  Tobacco Use  . Smoking status: Never Smoker  . Smokeless tobacco: Never Used  Vaping Use  . Vaping Use: Never used  Substance and Sexual Activity  . Alcohol use: Never  . Drug use: Never  . Sexual activity: Yes  Other Topics Concern  . Not on file  Social History Narrative   Married [name]    1 kid  [name] Daughter   And 1 stepkid and 1 adopted kid    Retired Scientist, water quality    Owns guns, wears seat belt, safe in relationship    Lives in [place], [place] and [place] in past    Social Determinants of Health   Financial Resource Strain: Faith   . Difficulty of Paying Living Expenses: Not hard at all  Food Insecurity: No Food Insecurity  . Worried About Charity fundraiser in the Last Year: Never true  . Ran Out of Food in the Last Year: Never true  Transportation Needs: No Transportation Needs  . Lack of Transportation (Medical): No  . Lack of Transportation (Non-Medical): No  Physical Activity: Sufficiently Active  . Days of Exercise per Week: 6 days  . Minutes of Exercise per Session: 30 min  Stress: No Stress Concern Present  . Feeling of Stress : Not at all  Social Connections: Unknown  . Frequency of Communication  with Friends and Family: Not on file  . Frequency of Social Gatherings with Friends and Family: Not on file  . Attends Religious Services: Not on file  . Active Member of Clubs or Organizations: Not on file  . Attends Archivist Meetings: Not on file  . Marital Status: Married    Tobacco Counseling Counseling given: Not Answered   Clinical Intake:  Pre-visit preparation completed: Yes        Diabetes: No  How often do you need to have someone help you when you read instructions, pamphlets, or other written materials from your doctor or pharmacy?: 1 - Never Interpreter Needed?: No      Activities of Daily Living In your present state of health, do you have any difficulty performing the following activities: 10/03/2019  Hearing? Y  Vision? N  Difficulty concentrating or making decisions? N  Walking or climbing stairs? N  Dressing or bathing? N  Doing errands, shopping? N  Preparing Food and eating ? N  Using the Toilet? N  In the past six months, have you accidently leaked urine? N  Do you have problems with loss of bowel control? N  Managing your Medications? N  Managing your Finances? N  Housekeeping or managing your Housekeeping? N  Some recent data might be hidden    Patient Care Team: McLean-Scocuzza, Nino Glow, MD as PCP - General (Internal Medicine)  Indicate any recent Medical Services you may have received from other than [facility] providers in the past year (date may be approximate).     Assessment:   This is a routine wellness examination for [place].  I connected with [place] today by telephone and verified that I am speaking with the correct person using two identifiers. Location patient: home Location provider: work Persons participating in the virtual visit: patient, Marine scientist.    I discussed the limitations, risks, security and privacy concerns of performing an evaluation and management service by telephone and the availability of in person  appointments. The patient expressed understanding and verbally consented to this telephonic visit.    Interactive audio and video telecommunications were attempted between this provider and patient, however failed, due to patient having technical difficulties OR patient did not have access to video capability.  We continued and completed visit with audio only.  Some vital signs may be absent or patient reported.   Hearing/Vision screen  Hearing Screening   125Hz  250Hz  500Hz  1000Hz  2000Hz  3000Hz  4000Hz  6000Hz  8000Hz   Right ear:  Left ear:           Comments: Followed by [facility] Hearing loss L ear. She wears a microphone on her left ear to pick up sound from the left side and direct to the right ear. (cross hearing aid).  Vision Screening Comments: Followed by Dr. Wallace [facility]). Wears corrective lenses  Visual acuity not assessed, virtual visit. They have seen their ophthalmologist in the last 12 months.   Dietary issues and exercise activities discussed: Current Exercise Habits: Home exercise routine, Type of exercise: walking;calisthenics, Frequency (Times/Week): 6, Intensity: Moderate  Goals    . Follow up with Provider as scheduled      Depression Screen PHQ 2/9 Scores 10/03/2019 05/24/2019 03/25/2019 09/30/2018 06/11/2018  PHQ - 2 Score 0 0 0 0 0    Fall Risk Fall Risk  10/03/2019 05/24/2019 03/25/2019 03/16/2019 09/30/2018  Falls in the past year? 0 0 0 0 0  Number falls in past yr: 0 0 0 - -  Injury with Fall? - 0 0 - -  Follow up Falls evaluation completed Falls evaluation completed Falls evaluation completed Falls evaluation completed -   Handrails in use when climbing stairs? Yes Home free of loose throw rugs in walkways, pet beds, electrical cords, etc? Yes  Adequate lighting in your home to reduce risk of falls? Yes   ASSISTIVE DEVICES UTILIZED TO PREVENT FALLS: Use of a cane, walker or w/c? No   TIMED UP AND GO: Was the test performed? No .  Virtual visit.   Cognitive Function:  Patient is alert and oriented x3.  Denies difficulty focusing, making decisions, memory loss.  Enjoys [employer] crossword puzzles, reading and luminosity brain challenging activities for brain health.    6CIT Screen 09/30/2018  What Year? 0 points  What month? 0 points  What time? 0 points  Count back from 20 0 points  Months in reverse 0 points  Repeat phrase 0 points  Total Score 0   Immunizations Immunization History  Administered Date(s) Administered  . Fluad Quad(high Dose 65+) 09/04/2018  . Influenza, High Dose Seasonal PF 10/21/2017  . PFIZER SARS-COV-2 Vaccination 02/13/2019, 03/10/2019    TDAP status: Due, Education has been provided regarding the importance of this vaccine. Advised may receive this vaccine at local pharmacy or Health Dept. Aware to provide a copy of the vaccination record if obtained from local pharmacy or Health Dept. Verbalized acceptance and understanding.  Deferred.   Influenza vaccine- deferred per paient preference. Plans to receive later in the season.   Pneumococcal vaccine- plans to receive later in the season.   Health Maintenance Health Maintenance  Topic Date Due  . INFLUENZA VACCINE  04/05/2020 (Originally 08/07/2019)  . TETANUS/TDAP  10/02/2020 (Originally [birth year]/1970)  . PNA vac Low Risk Adult (1 of 2 - PCV13) 10/02/2020 (Originally 01/20/2016)  . Fecal DNA (Cologuard)  11/16/2020  . MAMMOGRAM  03/20/2021  . DEXA SCAN  Completed  . COVID-19 Vaccine  Completed  . Hepatitis C Screening  Completed   Dental Screening: Recommended annual dental exams for proper oral hygiene.  Community Resource Referral / Chronic Care Management: CRR required this visit?  No   CCM required this visit?  No      Plan:   Keep all routine maintenance appointments.   I have personally reviewed and noted the following in the patient's chart:   . Medical and social history . Use of alcohol, tobacco or illicit drugs    . Current medications  and supplements . Functional ability and status . Nutritional status . Physical activity . Advanced directives . List of other physicians . Hospitalizations, surgeries, and ER visits in previous 12 months . Vitals . Screenings to include cognitive, depression, and falls . Referrals and appointments  In addition, I have reviewed and discussed with patient certain preventive protocols, quality metrics, and best practice recommendations. A written personalized care plan for preventive services as well as general preventive health recommendations were provided to patient via mychart.     Varney Biles, LPN   03/25/377

## 2019-10-03 NOTE — Patient Instructions (Addendum)
Monica Salazar , Thank you for taking time to come for your Medicare Wellness Visit. I appreciate your ongoing commitment to your health goals. Please review the following plan we discussed and let me know if I can assist you in the future.   These are the goals we discussed: Goals    . Follow up with Provider as scheduled       This is a list of the screening recommended for you and due dates:  Health Maintenance  Topic Date Due  . Flu Shot  04/05/2020*  . Tetanus Vaccine  10/02/2020*  . Pneumonia vaccines (1 of 2 - PCV13) 10/02/2020*  . Cologuard (Stool DNA test)  11/16/2020  . Mammogram  03/20/2021  . DEXA scan (bone density measurement)  Completed  . COVID-19 Vaccine  Completed  .  Hepatitis C: One time screening is recommended by Center for Disease Control  (CDC) for  adults born from [birth year] through 1964.   Completed  *Topic was postponed. The date shown is not the original due date.   Immunizations Immunization History  Administered Date(s) Administered  . Fluad Quad(high Dose 65+) 09/04/2018  . Influenza, High Dose Seasonal PF 10/21/2017  . PFIZER SARS-COV-2 Vaccination 02/13/2019, 03/10/2019   Advanced directives: End of life planning; Advance aging; Advanced directives discussed.  Copy of current HCPOA/Living Will requested.    Conditions/risks identified: none new.   Follow up in one year for your annual wellness visit.   Preventive Care 65 Years and Older, Female Preventive care refers to lifestyle choices and visits with your health care provider that can promote health and wellness. What does preventive care include?  A yearly physical exam. This is also called an annual well check.  Dental exams once or twice a year.  Routine eye exams. Ask your health care provider how often you should have your eyes checked.  Personal lifestyle choices, including:  Daily care of your teeth and gums.  Regular physical activity.  Eating a healthy diet.  Avoiding tobacco  and drug use.  Limiting alcohol use.  Practicing safe sex.  Taking low-dose aspirin every day.  Taking vitamin and mineral supplements as recommended by your health care provider. What happens during an annual well check? The services and screenings done by your health care provider during your annual well check will depend on your age, overall health, lifestyle risk factors, and family history of disease. Counseling  Your health care provider may ask you questions about your:  Alcohol use.  Tobacco use.  Drug use.  Emotional well-being.  Home and relationship well-being.  Sexual activity.  Eating habits.  History of falls.  Memory and ability to understand (cognition).  Work and work Statistician.  Reproductive health. Screening  You may have the following tests or measurements:  Height, weight, and BMI.  Blood pressure.  Lipid and cholesterol levels. These may be checked every 5 years, or more frequently if you are over 38 years old.  Skin check.  Lung cancer screening. You may have this screening every year starting at age 53 if you have a 30-pack-year history of smoking and currently smoke or have quit within the past 15 years.  Fecal occult blood test (FOBT) of the stool. You may have this test every year starting at age 57.  Flexible sigmoidoscopy or colonoscopy. You may have a sigmoidoscopy every 5 years or a colonoscopy every 10 years starting at age 73.  Hepatitis C blood test.  Hepatitis B blood test.  Sexually transmitted  disease (STD) testing.  Diabetes screening. This is done by checking your blood sugar (glucose) after you have not eaten for a while (fasting). You may have this done every 1-3 years.  Bone density scan. This is done to screen for osteoporosis. You may have this done starting at age 71.  Mammogram. This may be done every 1-2 years. Talk to your health care provider about how often you should have regular mammograms. Talk with  your health care provider about your test results, treatment options, and if necessary, the need for more tests. Vaccines  Your health care provider may recommend certain vaccines, such as:  Influenza vaccine. This is recommended every year.  Tetanus, diphtheria, and acellular pertussis (Tdap, Td) vaccine. You may need a Td booster every 10 years.  Zoster vaccine. You may need this after age 57.  Pneumococcal 13-valent conjugate (PCV13) vaccine. One dose is recommended after age 43.  Pneumococcal polysaccharide (PPSV23) vaccine. One dose is recommended after age 15. Talk to your health care provider about which screenings and vaccines you need and how often you need them. This information is not intended to replace advice given to you by your health care provider. Make sure you discuss any questions you have with your health care provider. Document Released: 01/19/2015 Document Revised: 09/12/2015 Document Reviewed: 10/24/2014 Elsevier Interactive Patient Education  2017 [place] Prevention in the Home Falls can cause injuries. They can happen to people of all ages. There are many things you can do to make your home safe and to help prevent falls. What can I do on the outside of my home?  Regularly fix the edges of walkways and driveways and fix any cracks.  Remove anything that might make you trip as you walk through a door, such as a raised step or threshold.  Trim any bushes or trees on the path to your home.  Use bright outdoor lighting.  Clear any walking paths of anything that might make someone trip, such as rocks or tools.  Regularly check to see if handrails are loose or broken. Make sure that both sides of any steps have handrails.  Any raised decks and porches should have guardrails on the edges.  Have any leaves, snow, or ice cleared regularly.  Use sand or salt on walking paths during winter.  Clean up any spills in your garage right away. This  includes oil or grease spills. What can I do in the bathroom?  Use night lights.  Install grab bars by the toilet and in the tub and shower. Do not use towel bars as grab bars.  Use non-skid mats or decals in the tub or shower.  If you need to sit down in the shower, use a plastic, non-slip stool.  Keep the floor dry. Clean up any water that spills on the floor as soon as it happens.  Remove soap buildup in the tub or shower regularly.  Attach bath mats securely with double-sided non-slip rug tape.  Do not have throw rugs and other things on the floor that can make you trip. What can I do in the bedroom?  Use night lights.  Make sure that you have a light by your bed that is easy to reach.  Do not use any sheets or blankets that are too big for your bed. They should not hang down onto the floor.  Have a firm chair that has side arms. You can use this for support while you get dressed.  Do not have throw rugs and other things on the floor that can make you trip. What can I do in the kitchen?  Clean up any spills right away.  Avoid walking on wet floors.  Keep items that you use a lot in easy-to-reach places.  If you need to reach something above you, use a strong step stool that has a grab bar.  Keep electrical cords out of the way.  Do not use floor polish or wax that makes floors slippery. If you must use wax, use non-skid floor wax.  Do not have throw rugs and other things on the floor that can make you trip. What can I do with my stairs?  Do not leave any items on the stairs.  Make sure that there are handrails on both sides of the stairs and use them. Fix handrails that are broken or loose. Make sure that handrails are as long as the stairways.  Check any carpeting to make sure that it is firmly attached to the stairs. Fix any carpet that is loose or worn.  Avoid having throw rugs at the top or bottom of the stairs. If you do have throw rugs, attach them to the  floor with carpet tape.  Make sure that you have a light switch at the top of the stairs and the bottom of the stairs. If you do not have them, ask someone to add them for you. What else can I do to help prevent falls?  Wear shoes that:  Do not have high heels.  Have rubber bottoms.  Are comfortable and fit you well.  Are closed at the toe. Do not wear sandals.  If you use a stepladder:  Make sure that it is fully opened. Do not climb a closed stepladder.  Make sure that both sides of the stepladder are locked into place.  Ask someone to hold it for you, if possible.  Clearly mark and make sure that you can see:  Any grab bars or handrails.  First and last steps.  Where the edge of each step is.  Use tools that help you move around (mobility aids) if they are needed. These include:  Canes.  Walkers.  Scooters.  Crutches.  Turn on the lights when you go into a dark area. Replace any light bulbs as soon as they burn out.  Set up your furniture so you have a clear path. Avoid moving your furniture around.  If any of your floors are uneven, fix them.  If there are any pets around you, be aware of where they are.  Review your medicines with your doctor. Some medicines can make you feel dizzy. This can increase your chance of falling. Ask your doctor what other things that you can do to help prevent falls. This information is not intended to replace advice given to you by your health care provider. Make sure you discuss any questions you have with your health care provider. Document Released: 10/19/2008 Document Revised: 05/31/2015 Document Reviewed: 01/27/2014 Elsevier Interactive Patient Education  2017 [employer].

## 2019-10-11 DIAGNOSIS — L500 Allergic urticaria: Secondary | ICD-10-CM | POA: Diagnosis not present

## 2019-10-11 DIAGNOSIS — D485 Neoplasm of uncertain behavior of skin: Secondary | ICD-10-CM | POA: Diagnosis not present

## 2019-10-20 DIAGNOSIS — Z23 Encounter for immunization: Secondary | ICD-10-CM | POA: Diagnosis not present

## 2019-10-26 DIAGNOSIS — Z23 Encounter for immunization: Secondary | ICD-10-CM | POA: Diagnosis not present

## 2019-11-07 DIAGNOSIS — H40003 Preglaucoma, unspecified, bilateral: Secondary | ICD-10-CM | POA: Diagnosis not present

## 2019-11-07 DIAGNOSIS — H47011 Ischemic optic neuropathy, right eye: Secondary | ICD-10-CM | POA: Diagnosis not present

## 2020-01-31 DIAGNOSIS — M150 Primary generalized (osteo)arthritis: Secondary | ICD-10-CM | POA: Diagnosis not present

## 2020-01-31 DIAGNOSIS — M659 Synovitis and tenosynovitis, unspecified: Secondary | ICD-10-CM | POA: Diagnosis not present

## 2020-01-31 DIAGNOSIS — M2022 Hallux rigidus, left foot: Secondary | ICD-10-CM | POA: Diagnosis not present

## 2020-02-13 DIAGNOSIS — D225 Melanocytic nevi of trunk: Secondary | ICD-10-CM | POA: Diagnosis not present

## 2020-02-13 DIAGNOSIS — D2272 Melanocytic nevi of left lower limb, including hip: Secondary | ICD-10-CM | POA: Diagnosis not present

## 2020-02-13 DIAGNOSIS — Z85828 Personal history of other malignant neoplasm of skin: Secondary | ICD-10-CM | POA: Diagnosis not present

## 2020-02-13 DIAGNOSIS — L821 Other seborrheic keratosis: Secondary | ICD-10-CM | POA: Diagnosis not present

## 2020-03-12 DIAGNOSIS — M659 Synovitis and tenosynovitis, unspecified: Secondary | ICD-10-CM | POA: Diagnosis not present

## 2020-03-12 DIAGNOSIS — M2022 Hallux rigidus, left foot: Secondary | ICD-10-CM | POA: Diagnosis not present

## 2020-03-12 DIAGNOSIS — M7061 Trochanteric bursitis, right hip: Secondary | ICD-10-CM | POA: Diagnosis not present

## 2020-03-12 DIAGNOSIS — M150 Primary generalized (osteo)arthritis: Secondary | ICD-10-CM | POA: Diagnosis not present

## 2020-04-13 DIAGNOSIS — H90A22 Sensorineural hearing loss, unilateral, left ear, with restricted hearing on the contralateral side: Secondary | ICD-10-CM | POA: Diagnosis not present

## 2020-04-19 DIAGNOSIS — Z23 Encounter for immunization: Secondary | ICD-10-CM | POA: Diagnosis not present

## 2020-06-05 DIAGNOSIS — H40003 Preglaucoma, unspecified, bilateral: Secondary | ICD-10-CM | POA: Diagnosis not present

## 2020-06-14 ENCOUNTER — Ambulatory Visit (INDEPENDENT_AMBULATORY_CARE_PROVIDER_SITE_OTHER): Payer: MEDICARE | Admitting: Internal Medicine

## 2020-06-14 ENCOUNTER — Encounter: Payer: Self-pay | Admitting: Internal Medicine

## 2020-06-14 ENCOUNTER — Ambulatory Visit (INDEPENDENT_AMBULATORY_CARE_PROVIDER_SITE_OTHER): Payer: MEDICARE

## 2020-06-14 ENCOUNTER — Other Ambulatory Visit: Payer: Self-pay

## 2020-06-14 VITALS — BP 110/70 | HR 68 | Temp 98.0°F | Ht 60.0 in | Wt 132.8 lb

## 2020-06-14 DIAGNOSIS — E785 Hyperlipidemia, unspecified: Secondary | ICD-10-CM

## 2020-06-14 DIAGNOSIS — Z0000 Encounter for general adult medical examination without abnormal findings: Secondary | ICD-10-CM | POA: Diagnosis not present

## 2020-06-14 DIAGNOSIS — M542 Cervicalgia: Secondary | ICD-10-CM | POA: Insufficient documentation

## 2020-06-14 DIAGNOSIS — G8929 Other chronic pain: Secondary | ICD-10-CM | POA: Insufficient documentation

## 2020-06-14 DIAGNOSIS — Z1329 Encounter for screening for other suspected endocrine disorder: Secondary | ICD-10-CM

## 2020-06-14 DIAGNOSIS — E559 Vitamin D deficiency, unspecified: Secondary | ICD-10-CM

## 2020-06-14 DIAGNOSIS — Z1231 Encounter for screening mammogram for malignant neoplasm of breast: Secondary | ICD-10-CM

## 2020-06-14 DIAGNOSIS — M25551 Pain in right hip: Secondary | ICD-10-CM

## 2020-06-14 NOTE — Progress Notes (Signed)
Chief Complaint  Patient presents with   Follow-up    yearly   Annual  1. Right neck pain lateral worse looking down and turning head to side started 04/2020 took 2 aleve x 2 weeks and helped but still pulling with movement  Had right hip injection for pain 03/2020 walking, cycling and dancing makes painful saw ortho in 03/2020 in [place] for this    Review of Systems  Constitutional:  Negative for weight loss.  HENT:  Positive for hearing loss.        Has aids    Eyes:  Negative for blurred vision.  Respiratory:  Negative for shortness of breath.   Cardiovascular:  Negative for chest pain.  Gastrointestinal:  Negative for abdominal pain.  Musculoskeletal:  Positive for joint pain and neck pain.  Skin:  Negative for rash.  Neurological:  Negative for headaches.  Psychiatric/Behavioral:  Negative for depression.   Past Medical History:  Diagnosis Date   Arthritis    hands   Hearing loss    left ear 09/27/16 MR, CT negative etiology she had rash neck 2 days before hearing loss left ear so ? virus   History of chicken pox    Migraine    tries Excedrine migraine-normally sees swigly lines/distorted vision unable to talk, left arm recently numb; senstive light and sound   Osteoporosis    UTI (urinary tract infection)    Vitamin D deficiency    Past Surgical History:  Procedure Laterality Date   [place]   hospitalization     2006 untx'ed UTI   TONSILLECTOMY  1966   Family History  Problem Relation Age of Onset   Lung cancer Mother    Early death Mother    Cancer Mother        lung cancer smoker    Heart attack Father    Stroke Father    Heart disease Father    Arthritis Brother        rheumatoid   Migraines Brother    Migraines Daughter    Diabetes Maternal Grandmother        died comp. DM 2    Early death Maternal Grandmother    Hypertension Maternal Grandmother    Early death Paternal Grandfather    Hearing loss Paternal  Grandfather    Social History   Socioeconomic History   Marital status: Married    Spouse name: Not on file   Number of children: Not on file   Years of education: Not on file   Highest education level: Not on file  Occupational History   Not on file  Tobacco Use   Smoking status: Never   Smokeless tobacco: Never  Vaping Use   Vaping Use: Never used  Substance and Sexual Activity   Alcohol use: Never   Drug use: Never   Sexual activity: Yes  Other Topics Concern   Not on file  Social History Narrative   Married [name]    1 kid [name] Daughter   And 1 stepkid and 1 adopted kid    Retired Scientist, water quality    Owns guns, wears seat belt, safe in relationship    Lives in [place], [place] and [place] in past    Social Determinants of Radio broadcast assistant Strain: Low Risk    Difficulty of Paying Living Expenses: Not hard at all  Food Insecurity: No Food Insecurity   Worried About  Running Out of Food in the Last Year: Never true   Ran Out of Food in the Last Year: Never true  Transportation Needs: No Transportation Needs   Lack of Transportation (Medical): No   Lack of Transportation (Non-Medical): No  Physical Activity: Sufficiently Active   Days of Exercise per Week: 6 days   Minutes of Exercise per Session: 30 min  Stress: No Stress Concern Present   Feeling of Stress : Not at all  Social Connections: Unknown   Frequency of Communication with Friends and Family: Not on file   Frequency of Social Gatherings with Friends and Family: Not on file   Attends Religious Services: Not on Electrical engineer or Organizations: Not on file   Attends Archivist Meetings: Not on file   Marital Status: Married  Human resources officer Violence: Not At Risk   Fear of Current or Ex-Partner: No   Emotionally Abused: No   Physically Abused: No   Sexually Abused: No   Current Meds  Medication Sig   Cholecalciferol (VITAMIN D3) 50 MCG (2000 UT) TABS Take by  mouth.   diclofenac sodium (VOLTAREN) 1 % GEL Apply topically every 14 (fourteen) days.   Turmeric (QC TUMERIC COMPLEX PO) Take by mouth.   Allergies  Allergen Reactions   Boniva [Ibandronic Acid]     Jaw pain    No results found for this or any previous visit (from the past 2160 hour(s)). Objective  Body mass index is 25.94 kg/m. Wt Readings from Last 3 Encounters:  06/14/20 132 lb 12.8 oz (60.2 kg)  10/03/19 129 lb (58.5 kg)  05/24/19 129 lb 9.6 oz (58.8 kg)   Temp Readings from Last 3 Encounters:  06/14/20 98 F (36.7 C) (Oral)  05/24/19 (!) 97.3 F (36.3 C) (Temporal)  03/25/19 (!) 96.7 F (35.9 C) (Temporal)   BP Readings from Last 3 Encounters:  06/14/20 110/70  05/24/19 114/78  03/25/19 120/82   Pulse Readings from Last 3 Encounters:  06/14/20 68  05/24/19 66  03/25/19 71    Physical Exam Vitals and nursing note reviewed.  Constitutional:      Appearance: Normal appearance. She is well-developed and well-groomed.  HENT:     Head: Normocephalic and atraumatic.  Eyes:     Conjunctiva/sclera: Conjunctivae normal.     Pupils: Pupils are equal, round, and reactive to light.  Cardiovascular:     Rate and Rhythm: Normal rate and regular rhythm.     Heart sounds: Normal heart sounds. No murmur heard. Pulmonary:     Effort: Pulmonary effort is normal.     Breath sounds: Normal breath sounds.  Abdominal:     General: Abdomen is flat.     Hernia: A hernia is present. Hernia is present in the umbilical area.  Musculoskeletal:     Cervical back: Tenderness present. Pain with movement present.  Skin:    General: Skin is warm and moist.  Neurological:     General: No focal deficit present.     Mental Status: She is alert and oriented to person, place, and time. Mental status is at baseline.     Gait: Gait normal.  Psychiatric:        Attention and Perception: Attention and perception normal.        Mood and Affect: Mood and affect normal.        Speech:  Speech normal.        Behavior: Behavior normal. Behavior is cooperative.  Thought Content: Thought content normal.        Cognition and Memory: Cognition and memory normal.        Judgment: Judgment normal.    Assessment  Plan  Cervicalgia - Plan: DG Cervical Spine Complete Consider PT Right hip pain  [place]  Hyperlipidemia, unspecified hyperlipidemia type - Plan: Lipid panel No FH  Rec healthy diet and exercise  HM Flu shot utd Tdap 2017 per pt   covid 19 had 4/4    Declines shingrix Consider prevnar or pna 23     hcv negative MMR immune    Never smoker   Get records pap (h/o normal per pt), mammo due 03/2018 per pt, colonoscopy 2011, mammogram, DEXA h/o osteoporosis (see above was given Boniva in the past noted 12/08/16), vaccines (Tdap, pna vaccines, zostervax)  Had zostavax x 1 declines shingrix for now   former PCP Dr. Shellee Milo in [place] [phone]    Out of age window pap  03/21/19 DEXA with h/o osteoporosis declines prolia  Mammogram 03/21/19 negative pt wants to wait until 03/19/21   11/16/17 negative cologuard  -declines further colonoscopies had in ? 2011 no h/o polyps and no FH colon cancer    2x dermatology mid back bx (left mid back). 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 Manuella Ghazi 03/19/18 Neurology  Provider: Dr. Olivia Mackie [facility]

## 2020-06-14 NOTE — Patient Instructions (Addendum)
Consider prevnar 13 vaccine and pneumonia 23 vaccine in 1 year after prevnar  -call back for nurse visit   Results for VERNE, COVE (MRN [id number]) as of 06/14/2020 09:17  Ref. Range 11/12/2017 08:20 03/21/2019 08:11  Cholesterol Latest Ref Range: 0 - 200 mg/dL 228 (H) 224 (H)  HDL Cholesterol Latest Ref Range: >39.00 mg/dL 62.60 75.00  LDL (calc) Latest Ref Range: 0 - 99 mg/dL 143 (H) 130 (H)  NonHDL Unknown 165.49 148.75  Triglycerides Latest Ref Range: 0.0 - 149.0 mg/dL 110.0 96.0  VLDL Latest Ref Range: 0.0 - 40.0 mg/dL 22.0 19.2    Pneumococcal Conjugate Vaccine (Prevnar 13) Suspension for Injection What is this medicine? PNEUMOCOCCAL VACCINE (NEU mo KOK al vak SEEN) is a vaccine used to prevent pneumococcus bacterial infections. These bacteria can cause serious infections like pneumonia, meningitis, and blood infections. This vaccine will lower your chance of getting pneumonia. If you do get pneumonia, it can make your symptoms milder and your illness shorter. This vaccine will not treat an infection and will not cause infection. This vaccine is recommended for infants and young children, adults with certain medical conditions, and adults 64 years or older. This medicine may be used for other purposes; ask your health care provider or pharmacist if you have questions. COMMON BRAND NAME(S): Prevnar, Prevnar 13 What should I tell my health care provider before I take this medicine? They need to know if you have any of these conditions: bleeding problems fever immune system problems an unusual or allergic reaction to pneumococcal vaccine, diphtheria toxoid, other vaccines, latex, other medicines, foods, dyes, or preservatives pregnant or trying to get pregnant breast-feeding How should I use this medicine? This vaccine is for injection into a muscle. It is given by a health care professional. A copy of Vaccine Information Statements will be given before each vaccination. Read this sheet  carefully each time. The sheet may change frequently. Talk to your pediatrician regarding the use of this medicine in children. While this drug may be prescribed for children as young as 61 weeks old for selected conditions, precautions do apply. Overdosage: If you think you have taken too much of this medicine contact a poison control center or emergency room at once. NOTE: This medicine is only for you. Do not share this medicine with others. What if I miss a dose? It is important not to miss your dose. Call your doctor or health care professional if you are unable to keep an appointment. What may interact with this medicine? medicines for cancer chemotherapy medicines that suppress your immune function steroid medicines like prednisone or cortisone This list may not describe all possible interactions. Give your health care provider a list of all the medicines, herbs, non-prescription drugs, or dietary supplements you use. Also tell them if you smoke, drink alcohol, or use illegal drugs. Some items may interact with your medicine. What should I watch for while using this medicine? Mild fever and pain should go away in 3 days or less. Report any unusual symptoms to your doctor or health care professional. What side effects may I notice from receiving this medicine? Side effects that you should report to your doctor or health care professional as soon as possible: allergic reactions like skin rash, itching or hives, swelling of the face, lips, or tongue breathing problems confused fast or irregular heartbeat fever over 102 degrees F seizures unusual bleeding or bruising unusual muscle weakness Side effects that usually do not require medical attention (report to your  doctor or health care professional if they continue or are bothersome): aches and pains diarrhea fever of 102 degrees F or less headache irritable loss of appetite pain, tender at site where injected trouble sleeping This  list may not describe all possible side effects. Call your doctor for medical advice about side effects. You may report side effects to FDA at 1-800-FDA-1088. Where should I keep my medicine? This does not apply. This vaccine is given in a clinic, pharmacy, doctor's office, or other health care setting and will not be stored at home. NOTE: This sheet is a summary. It may not cover all possible information. If you have questions about this medicine, talk to your doctor, pharmacist, or health care provider.  2021 Elsevier/Gold Standard (2013-09-29 10:27:27)  Neck Exercises Ask your health care provider which exercises are safe for you. Do exercises exactly as told by your health care provider and adjust them as directed. It is normal to feel mild stretching, pulling, tightness, or discomfort as you do these exercises. Stop right away if you feel sudden pain or your pain gets worse. Do not begin these exercises until told by your health care provider. Neck exercises can be important for many reasons. They can improve strength and maintain flexibility in your neck, which will help your upper back and prevent neck pain. Stretching exercises Rotation neck stretching Sit in a chair or stand up. Place your feet flat on the floor, shoulder width apart. Slowly turn your head (rotate) to the right until a slight stretch is felt. Turn it all the way to the right so you can look over your right shoulder. Do not tilt or tip your head. Hold this position for 10-30 seconds. Slowly turn your head (rotate) to the left until a slight stretch is felt. Turn it all the way to the left so you can look over your left shoulder. Do not tilt or tip your head. Hold this position for 10-30 seconds. Repeat __________ times. Complete this exercise __________ times a day.   Neck retraction Sit in a sturdy chair or stand up. Look straight ahead. Do not bend your neck. Use your fingers to push your chin backward (retraction).  Do not bend your neck for this movement. Continue to face straight ahead. If you are doing the exercise properly, you will feel a slight sensation in your throat and a stretch at the back of your neck. Hold the stretch for 1-2 seconds. Repeat __________ times. Complete this exercise __________ times a day. Strengthening exercises Neck press Lie on your back on a firm bed or on the floor with a pillow under your head. Use your neck muscles to push your head down on the pillow and straighten your spine. Hold the position as well as you can. Keep your head facing up (in a neutral position) and your chin tucked. Slowly count to 5 while holding this position. Repeat __________ times. Complete this exercise __________ times a day. Isometrics These are exercises in which you strengthen the muscles in your neck while keeping your neck still (isometrics). Sit in a supportive chair and place your hand on your forehead. Keep your head and face facing straight ahead. Do not flex or extend your neck while doing isometrics. Push forward with your head and neck while pushing back with your hand. Hold for 10 seconds. Do the sequence again, this time putting your hand against the back of your head. Use your head and neck to push backward against the hand pressure.  Finally, do the same exercise on either side of your head, pushing sideways against the pressure of your hand. Repeat __________ times. Complete this exercise __________ times a day. Prone head lifts Lie face-down (prone position), resting on your elbows so that your chest and upper back are raised. Start with your head facing downward, near your chest. Position your chin either on or near your chest. Slowly lift your head upward. Lift until you are looking straight ahead. Then continue lifting your head as far back as you can comfortably stretch. Hold your head up for 5 seconds. Then slowly lower it to your starting position. Repeat __________  times. Complete this exercise __________ times a day. Supine head lifts Lie on your back (supine position), bending your knees to point to the ceiling and keeping your feet flat on the floor. Lift your head slowly off the floor, raising your chin toward your chest. Hold for 5 seconds. Repeat __________ times. Complete this exercise __________ times a day. Scapular retraction Stand with your arms at your sides. Look straight ahead. Slowly pull both shoulders (scapulae) backward and downward (retraction) until you feel a stretch between your shoulder blades in your upper back. Hold for 10-30 seconds. Relax and repeat. Repeat __________ times. Complete this exercise __________ times a day. Contact a health care provider if: Your neck pain or discomfort gets much worse when you do an exercise. Your neck pain or discomfort does not improve within 2 hours after you exercise. If you have any of these problems, stop exercising right away. Do not do the exercises again unless your health care provider says that you can. Get help right away if: You develop sudden, severe neck pain. If this happens, stop exercising right away. Do not do the exercises again unless your health care provider says that you can. This information is not intended to replace advice given to you by your health care provider. Make sure you discuss any questions you have with your health care provider. Document Revised: 10/21/2017 Document Reviewed: 10/21/2017 Elsevier Patient Education  2021 [employer].

## 2020-06-18 ENCOUNTER — Other Ambulatory Visit (INDEPENDENT_AMBULATORY_CARE_PROVIDER_SITE_OTHER): Payer: MEDICARE

## 2020-06-18 ENCOUNTER — Other Ambulatory Visit: Payer: Self-pay

## 2020-06-18 DIAGNOSIS — Z1329 Encounter for screening for other suspected endocrine disorder: Secondary | ICD-10-CM

## 2020-06-18 DIAGNOSIS — E785 Hyperlipidemia, unspecified: Secondary | ICD-10-CM | POA: Diagnosis not present

## 2020-06-18 DIAGNOSIS — E559 Vitamin D deficiency, unspecified: Secondary | ICD-10-CM

## 2020-06-18 DIAGNOSIS — Z0000 Encounter for general adult medical examination without abnormal findings: Secondary | ICD-10-CM

## 2020-06-18 LAB — LIPID PANEL
Cholesterol: 213 mg/dL — ABNORMAL HIGH (ref 0–200)
HDL: 62.1 mg/dL (ref >39.00)
LDL Cholesterol: 132 mg/dL — ABNORMAL HIGH (ref 0–99)
NonHDL: 151.14
Total CHOL/HDL Ratio: 3
Triglycerides: 97 mg/dL (ref 0.0–149.0)
VLDL: 19.4 mg/dL (ref 0.0–40.0)

## 2020-06-18 LAB — COMPREHENSIVE METABOLIC PANEL
ALT: 11 U/L (ref 0–35)
AST: 18 U/L (ref 0–37)
Albumin: 4 g/dL (ref 3.5–5.2)
Alkaline Phosphatase: 57 U/L (ref 39–117)
BUN: 8 mg/dL (ref 6–23)
CO2: 29 mEq/L (ref 19–32)
Calcium: 9.1 mg/dL (ref 8.4–10.5)
Chloride: 105 mEq/L (ref 96–112)
Creatinine, Ser: 0.72 mg/dL (ref 0.40–1.20)
GFR: 85.32 mL/min (ref >60.00)
Glucose, Bld: 96 mg/dL (ref 70–99)
Potassium: 3.5 mEq/L (ref 3.5–5.1)
Sodium: 142 mEq/L (ref 135–145)
Total Bilirubin: 0.7 mg/dL (ref 0.2–1.2)
Total Protein: 6.5 g/dL (ref 6.0–8.3)

## 2020-06-18 LAB — CBC WITH DIFFERENTIAL/PLATELET
Basophils Absolute: 0.1 10*3/uL (ref 0.0–0.1)
Basophils Relative: 1.5 % (ref 0.0–3.0)
Eosinophils Absolute: 0.2 10*3/uL (ref 0.0–0.7)
Eosinophils Relative: 3.5 % (ref 0.0–5.0)
HCT: 40.9 % (ref 36.0–46.0)
Hemoglobin: 14.1 g/dL (ref 12.0–15.0)
Lymphocytes Relative: 43.4 % (ref 12.0–46.0)
Lymphs Abs: 2 10*3/uL (ref 0.7–4.0)
MCHC: 34.5 g/dL (ref 30.0–36.0)
MCV: 91.1 fl (ref 78.0–100.0)
Monocytes Absolute: 0.4 10*3/uL (ref 0.1–1.0)
Monocytes Relative: 8.7 % (ref 3.0–12.0)
Neutro Abs: 2 10*3/uL (ref 1.4–7.7)
Neutrophils Relative %: 42.9 % — ABNORMAL LOW (ref 43.0–77.0)
Platelets: 260 10*3/uL (ref 150.0–400.0)
RBC: 4.49 Mil/uL (ref 3.87–5.11)
RDW: 12.7 % (ref 11.5–15.5)
WBC: 4.5 10*3/uL (ref 4.0–10.5)

## 2020-06-18 LAB — TSH: TSH: 3.2 u[IU]/mL (ref 0.35–4.50)

## 2020-06-18 LAB — VITAMIN D 25 HYDROXY (VIT D DEFICIENCY, FRACTURES): VITD: 35.37 ng/mL (ref 30.00–100.00)

## 2020-06-19 NOTE — Progress Notes (Signed)
Left message for patient to return call back for xray results.

## 2020-08-14 IMAGING — DX DG HIP (WITH OR WITHOUT PELVIS) 2-3V*R*
2 series · 2 of 2 positions shown · non-contrast
Comparison: None.

CLINICAL DATA: Right hip pain.  No trauma history submitted.

EXAM:
DG HIP (WITH OR WITHOUT PELVIS) 2-3V RIGHT

[pelvis ap]
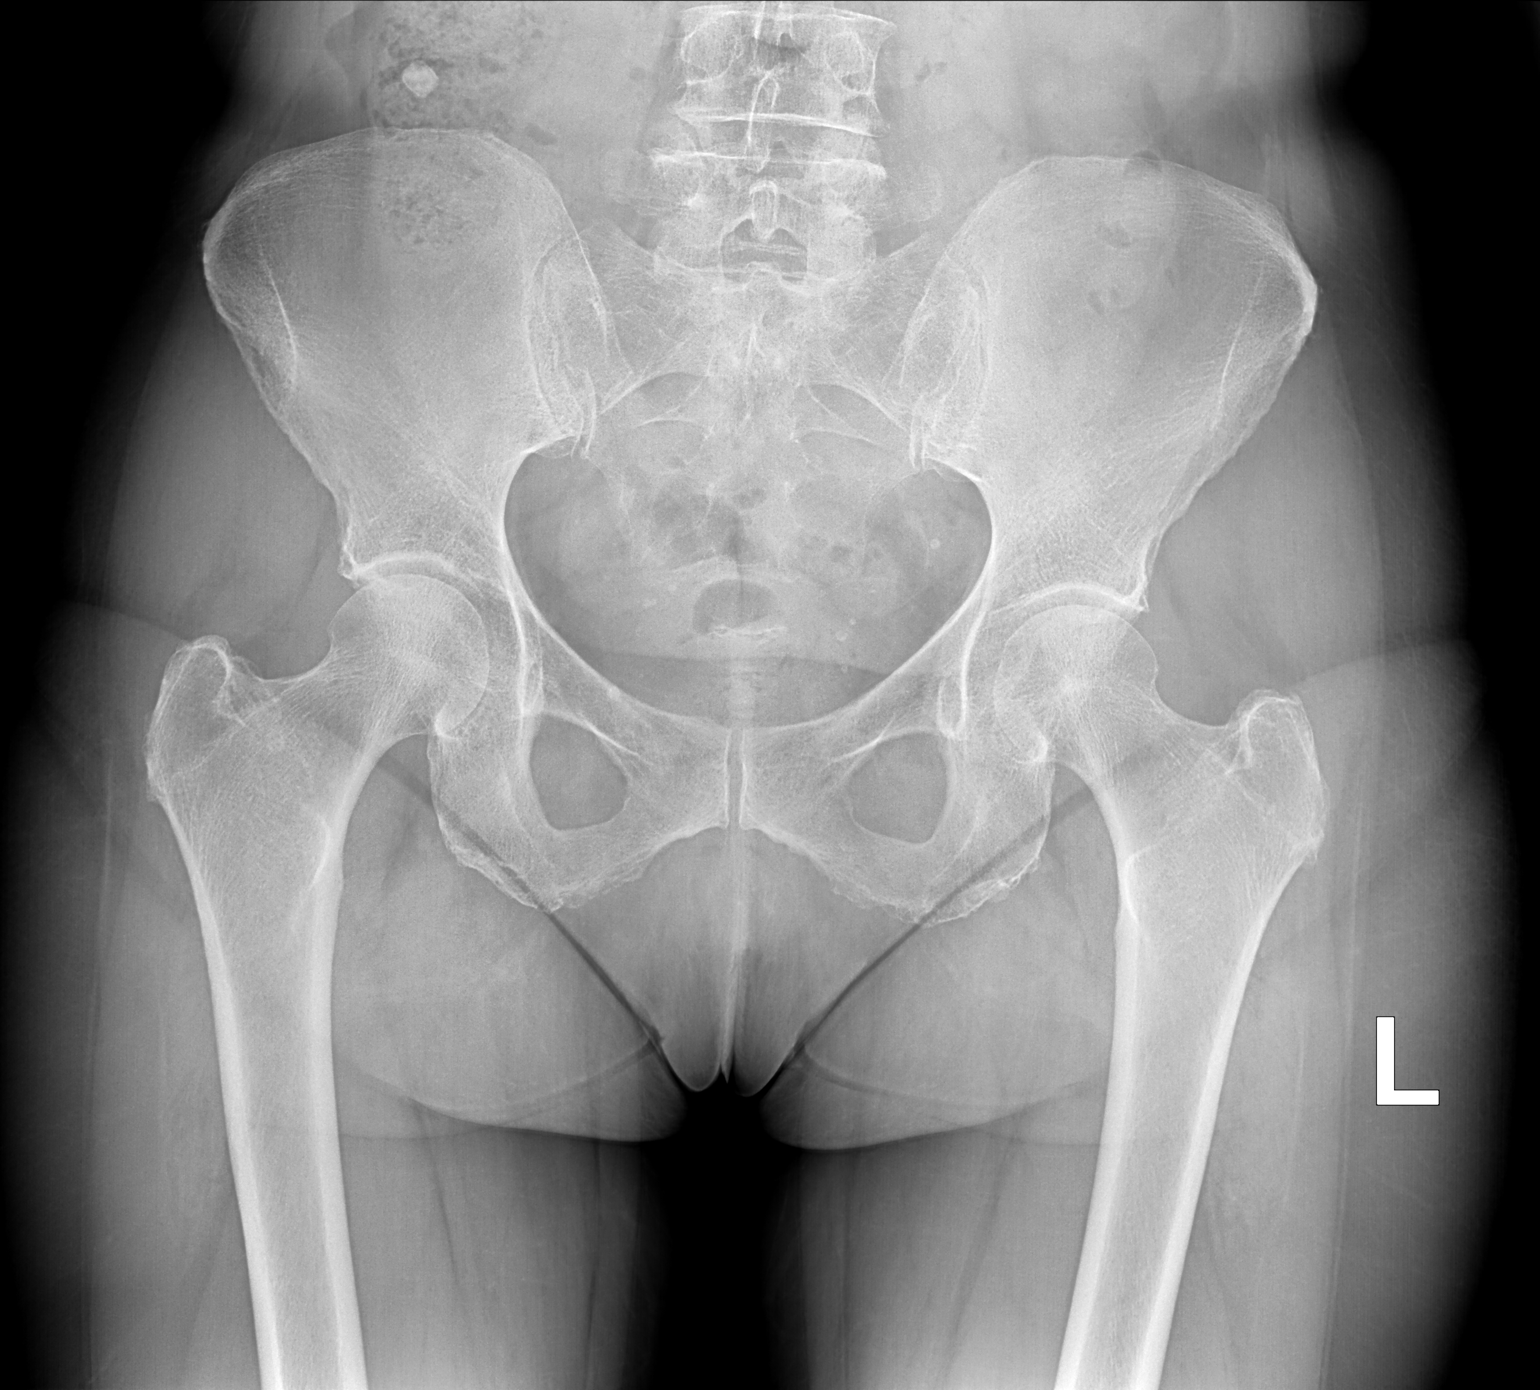

[ap frog obl (oblique)]
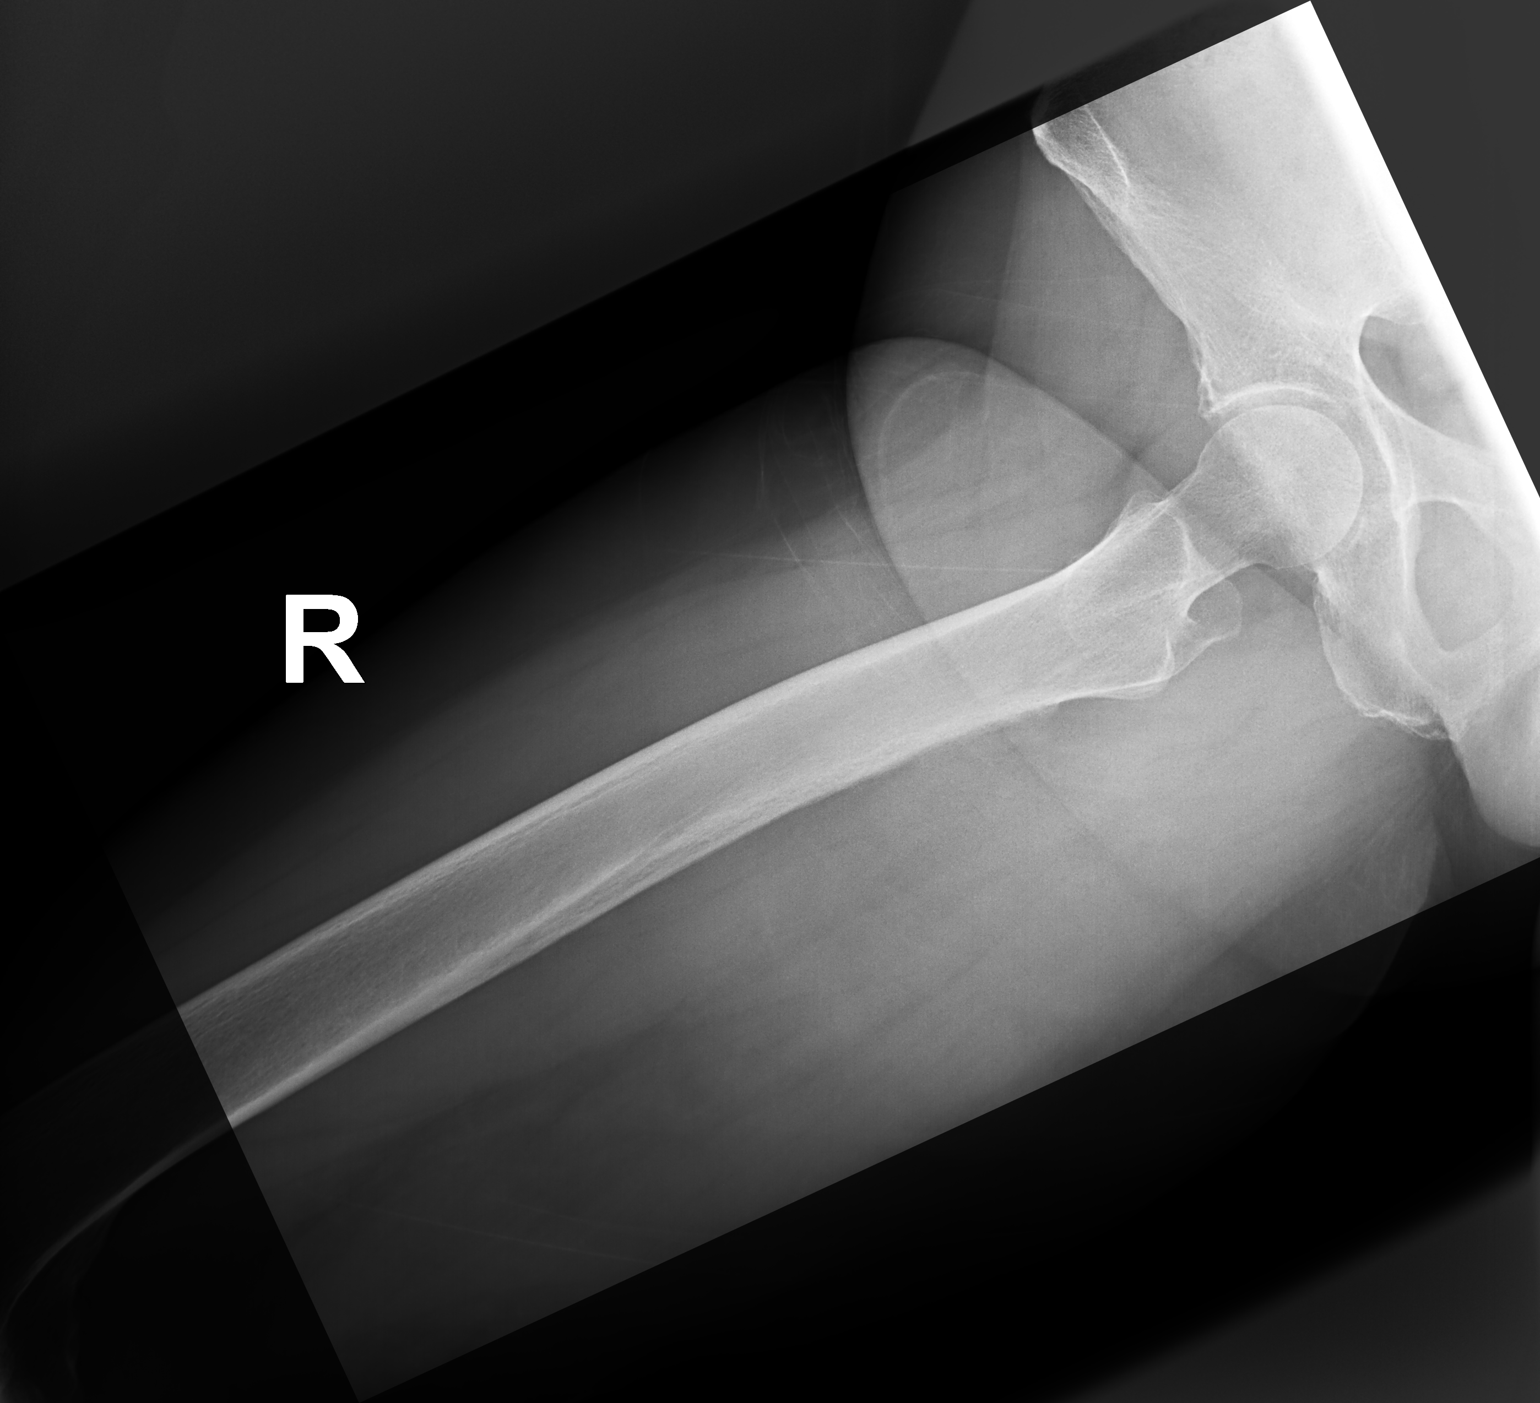

[2 of 2 positions shown; findings below may reference images not displayed]

FINDINGS: AP view the pelvis and frog-leg view of the right hip. Femoral heads
are located. Sacroiliac joints are symmetric. Joint spaces
maintained.
IMPRESSION: No acute osseous abnormality.

## 2020-08-24 ENCOUNTER — Encounter: Payer: Self-pay | Admitting: Internal Medicine

## 2020-09-29 DIAGNOSIS — Z23 Encounter for immunization: Secondary | ICD-10-CM | POA: Diagnosis not present

## 2020-10-03 ENCOUNTER — Ambulatory Visit: Payer: MEDICARE

## 2020-10-25 ENCOUNTER — Other Ambulatory Visit
Admission: RE | Admit: 2020-10-25 | Discharge: 2020-10-25 | Disposition: A | Discharge status: Home / Self Care | Payer: MEDICARE | Attending: Ophthalmology | Admitting: Ophthalmology

## 2020-10-25 DIAGNOSIS — R519 Headache, unspecified: Secondary | ICD-10-CM | POA: Insufficient documentation

## 2020-10-25 DIAGNOSIS — H47099 Other disorders of optic nerve, not elsewhere classified, unspecified eye: Secondary | ICD-10-CM | POA: Diagnosis not present

## 2020-10-25 DIAGNOSIS — H5711 Ocular pain, right eye: Secondary | ICD-10-CM | POA: Diagnosis not present

## 2020-10-25 LAB — CBC
HCT: 40.4 % (ref 36.0–46.0)
Hemoglobin: 14.1 g/dL (ref 12.0–15.0)
MCH: 31.7 pg (ref 26.0–34.0)
MCHC: 34.9 g/dL (ref 30.0–36.0)
MCV: 90.8 fL (ref 80.0–100.0)
Platelets: 257 10*3/uL (ref 150–400)
RBC: 4.45 MIL/uL (ref 3.87–5.11)
RDW: 12.6 % (ref 11.5–15.5)
WBC: 5.3 10*3/uL (ref 4.0–10.5)
nRBC: 0 % (ref 0.0–0.2)

## 2020-10-25 LAB — SEDIMENTATION RATE: Sed Rate: 9 mm/hr (ref 0–30)

## 2020-10-25 LAB — C-REACTIVE PROTEIN: CRP: 0.9 mg/dL (ref <1.0)

## 2020-10-27 DIAGNOSIS — Z23 Encounter for immunization: Secondary | ICD-10-CM | POA: Diagnosis not present

## 2020-11-28 ENCOUNTER — Other Ambulatory Visit: Payer: Self-pay

## 2020-11-28 DIAGNOSIS — Z1211 Encounter for screening for malignant neoplasm of colon: Secondary | ICD-10-CM

## 2020-11-28 DIAGNOSIS — Z1212 Encounter for screening for malignant neoplasm of rectum: Secondary | ICD-10-CM

## 2020-12-07 ENCOUNTER — Telehealth: Payer: Self-pay | Admitting: Internal Medicine

## 2020-12-07 NOTE — Telephone Encounter (Signed)
Patient received a colorguard kit in the mail. She states she did not order one. She wanted to know if Dr Olivia Mackie order this kit, patient stated at last visit  they discussed not having a colonoscopy, just had one 3 years ago. Patient wants to know what to do with box.

## 2020-12-07 NOTE — Telephone Encounter (Signed)
I called and informed patient that cologuard was ordered here & is to be done every three years. Pt stated that she was willing to do & send in.

## 2020-12-10 DIAGNOSIS — Z1212 Encounter for screening for malignant neoplasm of rectum: Secondary | ICD-10-CM | POA: Diagnosis not present

## 2020-12-10 DIAGNOSIS — Z1211 Encounter for screening for malignant neoplasm of colon: Secondary | ICD-10-CM | POA: Diagnosis not present

## 2020-12-11 ENCOUNTER — Telehealth: Payer: Self-pay | Admitting: Internal Medicine

## 2020-12-11 ENCOUNTER — Ambulatory Visit: Payer: MEDICARE | Admitting: Internal Medicine

## 2020-12-11 NOTE — Telephone Encounter (Signed)
Patient no-showed today's appointment; appointment was for 12/11/20, provider notified for review of record. Letter sent for patient to call in and re-schedule.

## 2020-12-14 LAB — COLOGUARD: COLOGUARD: NEGATIVE

## 2020-12-18 ENCOUNTER — Telehealth: Payer: Self-pay

## 2020-12-18 NOTE — Telephone Encounter (Signed)
-----   Message from Delorise Jackson, MD sent at 12/17/2020  9:20 AM EST ----- Cologuard negative but this is not 100% (we see false +s and false normal results) and colonoscopy is a better test In the future consider repeat colonoscopy we do them up until age 69

## 2020-12-19 NOTE — Telephone Encounter (Signed)
Patient received  and understood results of Cologuard.

## 2020-12-26 DIAGNOSIS — H47011 Ischemic optic neuropathy, right eye: Secondary | ICD-10-CM | POA: Diagnosis not present

## 2020-12-26 DIAGNOSIS — H35371 Puckering of macula, right eye: Secondary | ICD-10-CM | POA: Diagnosis not present

## 2021-02-11 DIAGNOSIS — D485 Neoplasm of uncertain behavior of skin: Secondary | ICD-10-CM | POA: Diagnosis not present

## 2021-02-11 DIAGNOSIS — L739 Follicular disorder, unspecified: Secondary | ICD-10-CM | POA: Diagnosis not present

## 2021-02-11 DIAGNOSIS — D2261 Melanocytic nevi of right upper limb, including shoulder: Secondary | ICD-10-CM | POA: Diagnosis not present

## 2021-02-11 DIAGNOSIS — Z85828 Personal history of other malignant neoplasm of skin: Secondary | ICD-10-CM | POA: Diagnosis not present

## 2021-02-11 DIAGNOSIS — D2262 Melanocytic nevi of left upper limb, including shoulder: Secondary | ICD-10-CM | POA: Diagnosis not present

## 2021-02-11 DIAGNOSIS — D2272 Melanocytic nevi of left lower limb, including hip: Secondary | ICD-10-CM | POA: Diagnosis not present

## 2021-02-14 DIAGNOSIS — M2022 Hallux rigidus, left foot: Secondary | ICD-10-CM | POA: Diagnosis not present

## 2021-02-14 DIAGNOSIS — M654 Radial styloid tenosynovitis [de Quervain]: Secondary | ICD-10-CM | POA: Diagnosis not present

## 2021-02-14 DIAGNOSIS — M150 Primary generalized (osteo)arthritis: Secondary | ICD-10-CM | POA: Diagnosis not present

## 2021-02-14 DIAGNOSIS — M659 Synovitis and tenosynovitis, unspecified: Secondary | ICD-10-CM | POA: Diagnosis not present

## 2021-02-14 DIAGNOSIS — G5601 Carpal tunnel syndrome, right upper limb: Secondary | ICD-10-CM | POA: Diagnosis not present

## 2021-03-04 ENCOUNTER — Telehealth: Payer: Self-pay | Admitting: Internal Medicine

## 2021-03-04 NOTE — Telephone Encounter (Signed)
Pt called stating that her hernia on her navel is giving her issues. Sent to access nurse

## 2021-03-05 ENCOUNTER — Encounter: Payer: Self-pay | Admitting: Internal Medicine

## 2021-03-05 ENCOUNTER — Ambulatory Visit (INDEPENDENT_AMBULATORY_CARE_PROVIDER_SITE_OTHER): Payer: MEDICARE | Admitting: Internal Medicine

## 2021-03-05 ENCOUNTER — Other Ambulatory Visit: Payer: Self-pay

## 2021-03-05 VITALS — BP 128/78 | HR 67 | Temp 98.0°F | Ht 60.0 in | Wt 134.4 lb

## 2021-03-05 DIAGNOSIS — R202 Paresthesia of skin: Secondary | ICD-10-CM

## 2021-03-05 DIAGNOSIS — K429 Umbilical hernia without obstruction or gangrene: Secondary | ICD-10-CM

## 2021-03-05 DIAGNOSIS — M47812 Spondylosis without myelopathy or radiculopathy, cervical region: Secondary | ICD-10-CM

## 2021-03-05 DIAGNOSIS — E785 Hyperlipidemia, unspecified: Secondary | ICD-10-CM | POA: Diagnosis not present

## 2021-03-05 DIAGNOSIS — R200 Anesthesia of skin: Secondary | ICD-10-CM

## 2021-03-05 DIAGNOSIS — Z0000 Encounter for general adult medical examination without abnormal findings: Secondary | ICD-10-CM

## 2021-03-05 NOTE — Patient Instructions (Addendum)
Check with insurance and see if one is in network   Consider general surgery  *Dr. Johnathan Hausen [facility] surgery)   [facility] Dr. Celine Mans Surgery  -Dr. Bary Castilla  -Dr. Zachery Dauer     Carpal Tunnel Syndrome -nerve conduction study  Hand orthopedics  [facility] (hand) [place] or [place]   Carpal tunnel syndrome is a condition that causes pain, numbness, and weakness in your hand and fingers. The carpal tunnel is a narrow area located on the palm side of your wrist. Repeated wrist motion or certain diseases may cause swelling within the tunnel. This swelling pinches the main nerve in the wrist. The main nerve in the wrist is called the median nerve. What are the causes? This condition may be caused by: Repeated and forceful wrist and hand motions. Wrist injuries. Arthritis. A cyst or tumor in the carpal tunnel. Fluid buildup during pregnancy. Use of tools that vibrate. Sometimes the cause of this condition is not known. What increases the risk? The following factors may make you more likely to develop this condition: Having a job that requires you to repeatedly or forcefully move your wrist or hand or requires you to use tools that vibrate. This may include jobs that involve using computers, working on an [employer], or working with [place] such as Pension scheme manager. Being a woman. Having certain conditions, such as: Diabetes. Obesity. An underactive thyroid (hypothyroidism). Kidney failure. Rheumatoid arthritis. What are the signs or symptoms? Symptoms of this condition include: A tingling feeling in your fingers, especially in your thumb, index, and middle fingers. Tingling or numbness in your hand. An aching feeling in your entire arm, especially when your wrist and elbow are bent for a long time. Wrist pain that goes up your arm to your shoulder. Pain that goes down into your palm or fingers. A weak feeling in your  hands. You may have trouble grabbing and holding items. Your symptoms may feel worse during the night. How is this diagnosed? This condition is diagnosed with a medical history and physical exam. You may also have tests, including: Electromyogram (EMG). This test measures electrical signals sent by your nerves into the muscles. Nerve conduction study. This test measures how well electrical signals pass through your nerves. Imaging tests, such as X-rays, ultrasound, and MRI. These tests check for possible causes of your condition. How is this treated? This condition may be treated with: Lifestyle changes. It is important to stop or change the activity that caused your condition. Doing exercise and activities to strengthen and stretch your muscles and tendons (physical therapy). Making lifestyle changes to help with your condition and learning how to do your daily activities safely (occupational therapy). Medicines for pain and inflammation. This may include medicine that is injected into your wrist. A wrist splint or brace. Surgery. Follow these instructions at home: If you have a splint or brace: Wear the splint or brace as told by your health care provider. Remove it only as told by your health care provider. Loosen the splint or brace if your fingers tingle, become numb, or turn cold and blue. Keep the splint or brace clean. If the splint or brace is not waterproof: Do not let it get wet. Cover it with a watertight covering when you take a bath or shower. Managing pain, stiffness, and swelling If directed, put ice on the painful area. To do this: If you have a removeable splint or brace, remove it as  told by your health care provider. Put ice in a plastic bag. Place a towel between your skin and the bag or between the splint or brace and the bag. Leave the ice on for 20 minutes, 2-3 times a day. Do not fall asleep with the cold pack on your skin. Remove the ice if your skin turns  bright red. This is very important. If you cannot feel pain, heat, or cold, you have a greater risk of damage to the area. Move your fingers often to reduce stiffness and swelling. General instructions Take over-the-counter and prescription medicines only as told by your health care provider. Rest your wrist and hand from any activity that may be causing your pain. If your condition is work related, talk with your employer about changes that can be made, such as getting a wrist pad to use while typing. Do any exercises as told by your health care provider, physical therapist, or occupational therapist. Keep all follow-up visits. This is important. Contact a health care provider if: You have new symptoms. Your pain is not controlled with medicines. Your symptoms get worse. Get help right away if: You have severe numbness or tingling in your wrist or hand. Summary Carpal tunnel syndrome is a condition that causes pain, numbness, and weakness in your hand and fingers. It is usually caused by repeated wrist motions. Lifestyle changes and medicines are used to treat carpal tunnel syndrome. Surgery may be recommended. Follow your health care provider's instructions about wearing a splint, resting from activity, keeping follow-up visits, and calling for help. This information is not intended to replace advice given to you by your health care provider. Make sure you discuss any questions you have with your health care provider. Document Revised: 05/05/2019 Document Reviewed: 05/05/2019 Elsevier Patient Education  2022 [place].  Umbilical Hernia, Adult A hernia is a bulge of tissue that pushes through an opening between muscles. An umbilical hernia happens in the abdomen, near the belly button (umbilicus). The hernia may contain tissues from the small intestine, large intestine, or fatty tissue covering the intestines. Umbilical hernias in adults tend to get worse over time, and they require  surgical treatment. There are different types of umbilical hernias, including: Indirect hernia. This type is located just above or below the umbilicus. It is the most common type of umbilical hernia in adults. Direct hernia. This type forms through an opening formed by the umbilicus. Reducible hernia. This type of hernia comes and goes. It may be visible only when you strain, lift something heavy, or cough. This type of hernia can be pushed back into the abdomen (reduced). Incarcerated hernia. This type traps abdominal tissue inside the hernia. This type of hernia cannot be reduced. Strangulated hernia. This type of hernia cuts off blood flow to the tissues inside the hernia. The tissues can start to die if this happens. This type of hernia requires emergency treatment. What are the causes? An umbilical hernia happens when tissue inside the abdomen presses on a weak area of the abdominal muscles. What increases the risk? You may have a greater risk of this condition if you: Are obese. Have had several pregnancies. Have a buildup of fluid inside your abdomen. Have had surgery that weakens the abdominal muscles. What are the signs or symptoms? The main symptom of this condition is a painless bulge at or near the belly button. A reducible hernia may be visible only when you strain, lift something heavy, or cough. Other symptoms may include: Dull  pain. A feeling of pressure. Symptoms of a strangulated hernia may include: Pain that gets increasingly worse. Nausea and vomiting. Pain when pressing on the hernia. Skin over the hernia becoming red or purple. Constipation. Blood in the stool. How is this diagnosed? This condition may be diagnosed based on: A physical exam. You may be asked to cough or strain while standing. These actions increase the pressure inside your abdomen and can force the hernia through the opening in your muscles. Your health care provider may try to reduce the hernia by  pressing on it. Your symptoms and medical history. How is this treated? Surgery is the only treatment for an umbilical hernia. Surgery for a strangulated hernia is done as soon as possible. If you have a small hernia that is not incarcerated, you may need to lose weight before having surgery. Follow these instructions at home: Lose weight, if told by your health care provider. Do not try to push the hernia back in. Watch your hernia for any changes in color or size. Tell your health care provider if any changes occur. You may need to avoid activities that increase pressure on your hernia. Do not lift anything that is heavier than 10 lb (4.5 kg), or the limit that you are told, until your health care provider says that it is safe. Take over-the-counter and prescription medicines only as told by your health care provider. Keep all follow-up visits. This is important. Contact a health care provider if: Your hernia gets larger. Your hernia becomes painful. Get help right away if: You develop sudden, severe pain near the area of your hernia. You have pain as well as nausea or vomiting. You have pain and the skin over your hernia changes color. You develop a fever or chills. Summary A hernia is a bulge of tissue that pushes through an opening between muscles. An umbilical hernia happens near the belly button. Surgery is the only treatment for an umbilical hernia. Do not try to push your hernia back in. Keep all follow-up visits. This is important. This information is not intended to replace advice given to you by your health care provider. Make sure you discuss any questions you have with your health care provider. Document Revised: 08/01/2019 Document Reviewed: 08/01/2019 Elsevier Patient Education  [place].

## 2021-03-05 NOTE — Progress Notes (Signed)
Chief Complaint  Patient presents with   Hernia   F/u  1. 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 Umbilical hernia mild pain at times but since gained wt 105 to 130s to 150s larger in size and had x 36 years pregnancy   ROS Past Medical History:  Diagnosis Date   Arthritis    hands   Hearing loss    left ear 09/27/16 MR, CT negative etiology she had rash neck 2 days before hearing loss left ear so ? virus   History of chicken pox    Migraine    tries Excedrine migraine-normally sees swigly lines/distorted vision unable to talk, left arm recently numb; senstive light and sound   Osteoporosis    UTI (urinary tract infection)    Vitamin D deficiency    Past Surgical History:  Procedure Laterality Date   [place]   hospitalization     2006 untx'ed UTI   TONSILLECTOMY  1966   Family History  Problem Relation Age of Onset   Lung cancer Mother    Early death Mother    Cancer Mother        lung cancer smoker    Heart attack Father    Stroke Father    Heart disease Father    Arthritis Brother        rheumatoid   Migraines Brother    Migraines Daughter    Diabetes Maternal Grandmother        died comp. DM 2    Early death Maternal Grandmother    Hypertension Maternal Grandmother    Early death Paternal Grandfather    Hearing loss Paternal Grandfather    Social History   Socioeconomic History   Marital status: Married    Spouse name: Not on file   Number of children: Not on file   Years of education: Not on file   Highest education level: Not on file  Occupational History   Not on file  Tobacco Use   Smoking status: Never   Smokeless tobacco: Never  Vaping Use   Vaping Use: Never used  Substance and Sexual Activity   Alcohol use: Never   Drug use: Never   Sexual activity:  Yes  Other Topics Concern   Not on file  Social History Narrative   Married [name]    1 kid [name] Daughter   And 1 stepkid and 1 adopted kid    Retired Scientist, water quality    Owns guns, wears seat belt, safe in relationship    Lives in [place], [place] and [place] in past    Social Determinants of Radio broadcast assistant Strain: Not on Art therapist Insecurity: Not on file  Transportation Needs: Not on file  Physical Activity: Not on file  Stress: Not on file  Social Connections: Not on file  Intimate Partner Violence: Not on file   Current Meds  Medication Sig   Cholecalciferol (VITAMIN D3) 50 MCG (2000 UT) TABS Take by mouth.   cyanocobalamin 1000 MCG tablet Take 1,000 mcg by mouth daily.   diclofenac sodium (VOLTAREN) 1 % GEL Apply topically every 14 (fourteen) days.   Turmeric (QC TUMERIC COMPLEX PO) Take by mouth.   Allergies  Allergen Reactions   Boniva [Ibandronic Acid]  Jaw pain    Recent Results (from the past 2160 hour(s))  Cologuard     Status: None   Collection Time: 12/10/20  6:15 AM  Result Value Ref Range   COLOGUARD Negative Negative    Comment:  NEGATIVE TEST RESULT. A negative Cologuard result indicates a low likelihood that a colorectal cancer (CRC) or advanced adenoma (adenomatous polyps with more advanced pre-malignant features)  is present. The chance that a person with a negative Cologuard test has a colorectal cancer is less than 1 in 1500 (negative predictive value >99.9%) or has an  advanced adenoma is less than  5.3% (negative predictive value 94.7%). These data are based on a prospective cross-sectional study of 10,000 individuals at average risk for colorectal cancer who were screened with both Cologuard and colonoscopy. (Imperiale T. et al, N Engl J Med 2014;370(14):1286-1297) The normal value (reference range) for this assay is negative.  COLOGUARD RE-SCREENING RECOMMENDATION: Periodic colorectal cancer screening is an important part of  preventive healthcare for asymptomatic individuals at average risk for colorectal cancer.  Following a negative Cologuard result, the [place] Task Force screening guidelines recommend a Cologuard re-screening interval of 3 years.  References: American Cancer Society Guideline for Colorectal Cancer Screening: https://www.cancer.org/cancer/colon-rectal-cancer/detection-diagnosis-staging/acs-recommendations.html.; Rex DK, Boland CR, Dominitz JK, Colorectal Cancer Screening: Recommendations for Physicians and Patients from the [place] Task Force on Colorectal Cancer Screening , Am J Gastroenterology 2017; 409:8119-1478.  TEST DESCRIPTION: Composite algorithmic analysis of stool DNA-biomarkers with hemoglobin immunoassay.   Quantitative values of individual biomarkers are not reportable and are not associated with individual biomarker result reference ranges. Cologuard is intended for colorectal cancer screening of adults of either sex, 76 years or older, who are at average-risk for colorectal cancer (CRC). Cologuard has been approved for use by the U.S. FDA. The performance of Cologuard was  established in a cross sectional study of average-risk adults aged 73-84. Cologuard performance in patients ages 99 to 59 years was estimated by sub-group analysis of near-age groups. Colonoscopies performed for a positive result may find as the most clinically significant lesion: colorectal cancer [4.0%], advanced adenoma (including sessile serrated polyps greater than or equal to 1cm diameter) [20%] or non- advanced adenoma [31%]; or no colorectal neoplasia [45%]. These estimates are derived from a prospective cross-sectional screening study of 10,000 individuals at average risk for colorectal cancer who were screened with both Cologuard and colonoscopy. (Imperiale T. et al, Alison Stalling J Med 2014;370(14):1286-1297.) Cologuard may produce a false negative or false positive result  (no colorectal cancer or precancerous polyp present at colonoscopy follow up). A negative Cologuard test result does not guarantee the absence of CRC or advanced adenoma (pre-cancer). The current Cologuard  screening interval is every 3 years. Paramedic and [employer] developer). Cologuard performance data in a 10,000 patient pivotal study using colonoscopy as the reference method can be accessed at the following location: www.exactlabs.com/results. Additional description of the Cologuard test process, warnings and precautions can be found at www.cologuard.com.    Objective  Body mass index is 26.25 kg/m. Wt Readings from Last 3 Encounters:  03/05/21 134 lb 6.4 oz (61 kg)  06/14/20 132 lb 12.8 oz (60.2 kg)  10/03/19 129 lb (58.5 kg)   Temp Readings from Last 3 Encounters:  03/05/21 98 F (36.7 C) (Oral)  06/14/20 98 F (36.7 C) (Oral)  05/24/19 (!) 97.3 F (36.3 C) (Temporal)   BP Readings from Last 3 Encounters:  03/05/21 128/78  06/14/20 110/70  05/24/19 114/78   Pulse Readings from Last 3 Encounters:  03/05/21 67  06/14/20 68  05/24/19 66    Physical Exam  Assessment  Plan  Umbilical hernia without obstruction and without gangrene    Provider: Dr. Olivia Mackie [facility]Lean[facility]

## 2021-03-05 NOTE — Telephone Encounter (Signed)
Patient scheduled to be seen today at 1:20 pm, 03/05/21.

## 2021-06-11 DIAGNOSIS — M79671 Pain in right foot: Secondary | ICD-10-CM | POA: Diagnosis not present

## 2021-06-11 DIAGNOSIS — M2021 Hallux rigidus, right foot: Secondary | ICD-10-CM | POA: Diagnosis not present

## 2021-06-18 ENCOUNTER — Other Ambulatory Visit: Payer: MEDICARE

## 2021-06-25 DIAGNOSIS — H47011 Ischemic optic neuropathy, right eye: Secondary | ICD-10-CM | POA: Diagnosis not present

## 2021-06-25 DIAGNOSIS — H02403 Unspecified ptosis of bilateral eyelids: Secondary | ICD-10-CM | POA: Diagnosis not present

## 2021-06-25 DIAGNOSIS — H40003 Preglaucoma, unspecified, bilateral: Secondary | ICD-10-CM | POA: Diagnosis not present

## 2021-06-28 DIAGNOSIS — H02403 Unspecified ptosis of bilateral eyelids: Secondary | ICD-10-CM | POA: Diagnosis not present

## 2021-07-11 DIAGNOSIS — H02403 Unspecified ptosis of bilateral eyelids: Secondary | ICD-10-CM | POA: Diagnosis not present

## 2021-09-23 DIAGNOSIS — Z23 Encounter for immunization: Secondary | ICD-10-CM | POA: Diagnosis not present

## 2021-09-30 ENCOUNTER — Telehealth: Payer: Self-pay | Admitting: Internal Medicine

## 2021-09-30 NOTE — Telephone Encounter (Signed)
Please sch appt today or tomorrow with available provider virtual appt

## 2021-09-30 NOTE — Telephone Encounter (Signed)
Patient tested positive for COVID today, symptoms started yesterday. Patient is requesting antiviral medication.

## 2021-10-01 ENCOUNTER — Encounter: Payer: Self-pay | Admitting: Internal Medicine

## 2021-10-01 ENCOUNTER — Telehealth (INDEPENDENT_AMBULATORY_CARE_PROVIDER_SITE_OTHER): Payer: MEDICARE | Admitting: Internal Medicine

## 2021-10-01 VITALS — Temp 100.5°F | Ht 60.0 in | Wt 134.0 lb

## 2021-10-01 DIAGNOSIS — U071 COVID-19: Secondary | ICD-10-CM | POA: Diagnosis not present

## 2021-10-01 MED ORDER — ALBUTEROL SULFATE HFA 108 (90 BASE) MCG/ACT IN AERS: 1.0000 | INHALATION_SPRAY | Freq: Four times a day (QID) | RESPIRATORY_TRACT | 0 refills | Status: DC | PRN

## 2021-10-01 MED ORDER — MOLNUPIRAVIR EUA 200MG CAPSULE: 4.0000 | ORAL_CAPSULE | Freq: Two times a day (BID) | ORAL | 0 refills | Status: AC

## 2021-10-01 NOTE — Patient Instructions (Signed)
If needing prescription strength medication we will need to make an appointment with a provider.  These are over the counter medication options:  Mucinex dm green label for cough or robitussin DM  Multivitamin or below vitamins  Vitamin C 1000 mg daily.  Vitamin D3 4000 Iu (units) daily.  Zinc 100 mg daily.  Quercetin 250-500 mg 2 times per day   Elderberry  Oil of oregano  cepacol or chloroseptic spray Warm salt water gargles +hydrogen peroxide Sugar free cough drops  Warm tea with honey and lemon  Hydration  Try to eat though you dont feel like it   Tylenol or Advil  Nasal saline and Flonase 2 sprays nasal congestion  If sneezing/runny nose over the counter allergy pill claritin,allegra, zyrtec, xyzal Quarantine x 10-14 days 14 days preferred   Monitor pulse oximeter, buy from [place] if oxygen is less than 90 please go to the hospital.        Are you feeling really sick? Shortness of breath, cough, chest pain?, dizziness? Confusion   If so let me know  If worsening, go to hospital or [facility] Urgent care for further treatment.

## 2021-10-01 NOTE — Progress Notes (Signed)
Virtual Visit via Video Note  I connected with Monica Salazar  on 10/01/21 at  2:20 PM EDT by a video enabled telemedicine application and verified that I am speaking with the correct person using two identifiers.  Location patient: [place] Location provider:work or home office Persons participating in the virtual visit: patient, provider  I discussed the limitations and requested verbal permission for telemedicine visit. The patient expressed understanding and agreed to proceed.   HPI:  Acute telemedicine visit for : 9/18 she had covid booster and rode train the following firday and went to theater performance Monday then 'Sunday not feeling well Monday 09/30/21 + covid no cough bu thas h/a Sunday and fatigue and fever to 100.5. O2 at rest 90% no sob  Last dose tylenol Sunday   -Pertinent past medical history: see below -Pertinent medication allergies: Allergies  Allergen Reactions   Boniva [Ibandronic Acid]     Jaw pain    -COVID-19 vaccine status:  Immunization History  Administered Date(s) Administered   Fluad Quad(high Dose 65+) 09/04/2018   Influenza, High Dose Seasonal PF 10/21/2017   Influenza,inj,Quad PF,6+ Mos 10/26/2019   PFIZER(Purple Top)SARS-COV-2 Vaccination 02/13/2019, 03/10/2019, 10/20/2019, 06/05/2020   Pfizer Covid-19 Vaccine Bivalent Booster 12yrs & up 09/29/2020     ROS: See pertinent positives and negatives per HPI.  Past Medical History:  Diagnosis Date   Arthritis    hands   COVID-19    09/2021   Hearing loss    left ear 09/27/16 MR, CT negative etiology she had rash neck 2 days before hearing loss left ear so ? virus   History of chicken pox    Migraine    tries Excedrine migraine-normally sees swigly lines/distorted vision unable to talk, left arm recently numb; senstive light and sound   Osteoporosis    UTI (urinary tract infection)    Vitamin D deficiency     Past Surgical History:  Procedure Laterality Date   BREAST BIOPSY     CESAREAN  SECTION     1986   hospitalization     20'$ 06 untx'ed UTI   TONSILLECTOMY  1966     Current Outpatient Medications:    albuterol (VENTOLIN HFA) 108 (90 Base) MCG/ACT inhaler, Inhale 1-2 puffs into the lungs every 6 (six) hours as needed for wheezing or shortness of breath., Disp: 18 g, Rfl: 0   Cholecalciferol (VITAMIN D3) 50 MCG (2000 UT) TABS, Take by mouth., Disp: , Rfl:    cyanocobalamin 1000 MCG tablet, Take 1,000 mcg by mouth daily., Disp: , Rfl:    diclofenac sodium (VOLTAREN) 1 % GEL, Apply topically every 14 (fourteen) days., Disp: , Rfl:    molnupiravir EUA (LAGEVRIO) 200 mg CAPS capsule, Take 4 capsules (800 mg total) by mouth 2 (two) times daily for 5 days. With food, Disp: 40 capsule, Rfl: 0   Turmeric (QC TUMERIC COMPLEX PO), Take by mouth., Disp: , Rfl:   EXAM:  VITALS per patient if applicable:  GENERAL: alert, oriented, appears well and in no acute distress  HEENT: atraumatic, conjunttiva clear, no obvious abnormalities on inspection of external nose and ears  NECK: normal movements of the head and neck  LUNGS: on inspection no signs of respiratory distress, breathing rate appears normal, no obvious gross SOB, gasping or wheezing  CV: no obvious cyanosis  MS: moves all visible extremities without noticeable abnormality  PSYCH/NEURO: pleasant and cooperative, no obvious depression or anxiety, speech and thought processing grossly intact  ASSESSMENT AND PLAN:  Discussed the  following assessment and plan:  COVID-19 - Plan: molnupiravir EUA (LAGEVRIO) 400 mg CAPS capsule bid x 5 days, albuterol (VENTOLIN HFA) 108 (90 Base) MCG/ACT inhaler Supportive care   -we discussed possible serious and likely etiologies, options for evaluation and workup, limitations of telemedicine visit vs in person visit, treatment, treatment risks and precautions. Pt is agreeable to treatment via telemedicine at this moment.   I discussed the assessment and treatment plan with the  patient. The patient was provided an opportunity to ask questions and all were answered. The patient agreed with the plan and demonstrated an understanding of the instructions.    Time spent 20 minute s Delorise Jackson, MD

## 2021-10-10 ENCOUNTER — Ambulatory Visit (INDEPENDENT_AMBULATORY_CARE_PROVIDER_SITE_OTHER): Payer: MEDICARE

## 2021-10-10 VITALS — Ht 60.0 in | Wt 134.0 lb

## 2021-10-10 DIAGNOSIS — Z0000 Encounter for general adult medical examination without abnormal findings: Secondary | ICD-10-CM | POA: Diagnosis not present

## 2021-10-10 NOTE — Patient Instructions (Addendum)
Monica Salazar , Thank you for taking time to come for your Medicare Wellness Visit. I appreciate your ongoing commitment to your health goals. Please review the following plan we discussed and let me know if I can assist you in the future.   These are the goals we discussed:  Goals      Follow up with Provider as scheduled        This is a list of the screening recommended for you and due dates:  Health Maintenance  Topic Date Due   Pneumonia Vaccine (1 - PCV) 12/06/2021*   Mammogram  12/06/2021*   Tetanus Vaccine  12/06/2021*   Zoster (Shingles) Vaccine (1 of 2) 01/10/2022*   COVID-19 Vaccine (7 - Pfizer risk series) 11/18/2021   Cologuard (Stool DNA test)  12/11/2023   Flu Shot  Completed   DEXA scan (bone density measurement)  Completed   Hepatitis C Screening: USPSTF Recommendation to screen - Ages 54-79 yo.  Completed   HPV Vaccine  Aged Out  *Topic was postponed. The date shown is not the original due date.    Advanced directives: End of life planning; Advance aging; Advanced directives discussed.  Copy of current HCPOA/Living Will requested.    Conditions/risks identified: none new  Next appointment: Follow up in one year for your annual wellness visit    Preventive Care 65 Years and Older, Female Preventive care refers to lifestyle choices and visits with your health care provider that can promote health and wellness. What does preventive care include? A yearly physical exam. This is also called an annual well check. Dental exams once or twice a year. Routine eye exams. Ask your health care provider how often you should have your eyes checked. Personal lifestyle choices, including: Daily care of your teeth and gums. Regular physical activity. Eating a healthy diet. Avoiding tobacco and drug use. Limiting alcohol use. Practicing safe sex. Taking low-dose aspirin every day. Taking vitamin and mineral supplements as recommended by your health care provider. What  happens during an annual well check? The services and screenings done by your health care provider during your annual well check will depend on your age, overall health, lifestyle risk factors, and family history of disease. Counseling  Your health care provider may ask you questions about your: Alcohol use. Tobacco use. Drug use. Emotional well-being. Home and relationship well-being. Sexual activity. Eating habits. History of falls. Memory and ability to understand (cognition). Work and work Statistician. Reproductive health. Screening  You may have the following tests or measurements: Height, weight, and BMI. Blood pressure. Lipid and cholesterol levels. These may be checked every 5 years, or more frequently if you are over 60 years old. Skin check. Lung cancer screening. You may have this screening every year starting at age 21 if you have a 30-pack-year history of smoking and currently smoke or have quit within the past 15 years. Fecal occult blood test (FOBT) of the stool. You may have this test every year starting at age 33. Flexible sigmoidoscopy or colonoscopy. You may have a sigmoidoscopy every 5 years or a colonoscopy every 10 years starting at age 57. Hepatitis C blood test. Hepatitis B blood test. Sexually transmitted disease (STD) testing. Diabetes screening. This is done by checking your blood sugar (glucose) after you have not eaten for a while (fasting). You may have this done every 1-3 years. Bone density scan. This is done to screen for osteoporosis. You may have this done starting at age 42. Mammogram. This may  be done every 1-2 years. Talk to your health care provider about how often you should have regular mammograms. Talk with your health care provider about your test results, treatment options, and if necessary, the need for more tests. Vaccines  Your health care provider may recommend certain vaccines, such as: Influenza vaccine. This is recommended every  year. Tetanus, diphtheria, and acellular pertussis (Tdap, Td) vaccine. You may need a Td booster every 10 years. Zoster vaccine. You may need this after age 25. Pneumococcal 13-valent conjugate (PCV13) vaccine. One dose is recommended after age 6. Pneumococcal polysaccharide (PPSV23) vaccine. One dose is recommended after age 52. Talk to your health care provider about which screenings and vaccines you need and how often you need them. This information is not intended to replace advice given to you by your health care provider. Make sure you discuss any questions you have with your health care provider. Document Released: 01/19/2015 Document Revised: 09/12/2015 Document Reviewed: 10/24/2014 Elsevier Interactive Patient Education  2017 [employer] Prevention in the Home Falls can cause injuries. They can happen to people of all ages. There are many things you can do to make your home safe and to help prevent falls. What can I do on the outside of my home? Regularly fix the edges of walkways and driveways and fix any cracks. Remove anything that might make you trip as you walk through a door, such as a raised step or threshold. Trim any bushes or trees on the path to your home. Use bright outdoor lighting. Clear any walking paths of anything that might make someone trip, such as rocks or tools. Regularly check to see if handrails are loose or broken. Make sure that both sides of any steps have handrails. Any raised decks and porches should have guardrails on the edges. Have any leaves, snow, or ice cleared regularly. Use sand or salt on walking paths during winter. Clean up any spills in your garage right away. This includes oil or grease spills. What can I do in the bathroom? Use night lights. Install grab bars by the toilet and in the tub and shower. Do not use towel bars as grab bars. Use non-skid mats or decals in the tub or shower. If you need to sit down in the shower, use a  plastic, non-slip stool. Keep the floor dry. Clean up any water that spills on the floor as soon as it happens. Remove soap buildup in the tub or shower regularly. Attach bath mats securely with double-sided non-slip rug tape. Do not have throw rugs and other things on the floor that can make you trip. What can I do in the bedroom? Use night lights. Make sure that you have a light by your bed that is easy to reach. Do not use any sheets or blankets that are too big for your bed. They should not hang down onto the floor. Have a firm chair that has side arms. You can use this for support while you get dressed. Do not have throw rugs and other things on the floor that can make you trip. What can I do in the kitchen? Clean up any spills right away. Avoid walking on wet floors. Keep items that you use a lot in easy-to-reach places. If you need to reach something above you, use a strong step stool that has a grab bar. Keep electrical cords out of the way. Do not use floor polish or wax that makes floors slippery. If you must use  wax, use non-skid floor wax. Do not have throw rugs and other things on the floor that can make you trip. What can I do with my stairs? Do not leave any items on the stairs. Make sure that there are handrails on both sides of the stairs and use them. Fix handrails that are broken or loose. Make sure that handrails are as long as the stairways. Check any carpeting to make sure that it is firmly attached to the stairs. Fix any carpet that is loose or worn. Avoid having throw rugs at the top or bottom of the stairs. If you do have throw rugs, attach them to the floor with carpet tape. Make sure that you have a light switch at the top of the stairs and the bottom of the stairs. If you do not have them, ask someone to add them for you. What else can I do to help prevent falls? Wear shoes that: Do not have high heels. Have rubber bottoms. Are comfortable and fit you  well. Are closed at the toe. Do not wear sandals. If you use a stepladder: Make sure that it is fully opened. Do not climb a closed stepladder. Make sure that both sides of the stepladder are locked into place. Ask someone to hold it for you, if possible. Clearly mark and make sure that you can see: Any grab bars or handrails. First and last steps. Where the edge of each step is. Use tools that help you move around (mobility aids) if they are needed. These include: Canes. Walkers. Scooters. Crutches. Turn on the lights when you go into a dark area. Replace any light bulbs as soon as they burn out. Set up your furniture so you have a clear path. Avoid moving your furniture around. If any of your floors are uneven, fix them. If there are any pets around you, be aware of where they are. Review your medicines with your doctor. Some medicines can make you feel dizzy. This can increase your chance of falling. Ask your doctor what other things that you can do to help prevent falls. This information is not intended to replace advice given to you by your health care provider. Make sure you discuss any questions you have with your health care provider. Document Released: 10/19/2008 Document Revised: 05/31/2015 Document Reviewed: 01/27/2014 Elsevier Interactive Patient Education  2017 [employer].

## 2021-10-10 NOTE — Progress Notes (Signed)
Subjective:   Monica Salazar is a 71 y.o. female who presents for Medicare Annual (Subsequent) preventive examination.  Review of Systems    No ROS.  Medicare Wellness Virtual Visit.  Visual/audio telehealth visit, UTA vital signs.   See social history for additional risk factors.   Cardiac Risk Factors include: advanced age (>64mn, >>60women)     Objective:    Today's Vitals   10/10/21 1007  Weight: 134 lb (60.8 kg)  Height: 5' (1.524 m)   Body mass index is 26.17 kg/m.     10/10/2021   10:30 AM 10/03/2019    9:56 AM 09/30/2018   10:07 AM  Advanced Directives  Does Patient Have a Medical Advance Directive? Yes Yes Yes  Type of AParamedicof [place]Living will [place] will [place] will  Does patient want to make changes to medical advance directive? No - Patient declined No - Patient declined No - Patient declined  Copy of [place] Damin Chart? No - copy requested No - copy requested No - copy requested    Current Medications (verified) Outpatient Encounter Medications as of 10/10/2021  Medication Sig   albuterol (VENTOLIN HFA) 108 (90 Base) MCG/ACT inhaler Inhale 1-2 puffs into the lungs every 6 (six) hours as needed for wheezing or shortness of breath.   Cholecalciferol (VITAMIN D3) 50 MCG (2000 UT) TABS Take by mouth.   cyanocobalamin 1000 MCG tablet Take 1,000 mcg by mouth daily.   diclofenac sodium (VOLTAREN) 1 % GEL Apply topically every 14 (fourteen) days.   Turmeric (QC TUMERIC COMPLEX PO) Take by mouth.   No facility-administered encounter medications on file as of 10/10/2021.    Allergies (verified) Boniva [ibandronic acid]   History: Past Medical History:  Diagnosis Date   Arthritis    hands   COVID-19    09/2021   Hearing loss    left ear 09/27/16 MR, CT negative etiology she had rash neck 2 days before hearing loss left ear so ? virus   History of chicken  pox    Migraine    tries Excedrine migraine-normally sees swigly lines/distorted vision unable to talk, left arm recently numb; senstive light and sound   Osteoporosis    UTI (urinary tract infection)    Vitamin D deficiency    Past Surgical History:  Procedure Laterality Date   [place]  hospitalization     2006 untx'ed UTI   TONSILLECTOMY  1966   Family History  Problem Relation Age of Onset   Lung cancer Mother    Early death Mother    Cancer Mother        lung cancer smoker    Heart attack Father    Stroke Father    Heart disease Father    Arthritis Brother        rheumatoid   Migraines Brother    Migraines Daughter    Diabetes Maternal Grandmother        died comp. DM 2    Early death Maternal Grandmother    Hypertension Maternal Grandmother    Early death Paternal Grandfather    Hearing loss Paternal Grandfather    Social History   Socioeconomic History   Marital status: Married    Spouse name: Not on file   Number of children: Not on file   Years of education: Not on file  Highest education level: Not on file  Occupational History   Not on file  Tobacco Use   Smoking status: Never   Smokeless tobacco: Never  Vaping Use   Vaping Use: Never used  Substance and Sexual Activity   Alcohol use: Never   Drug use: Never   Sexual activity: Yes  Other Topics Concern   Not on file  Social History Narrative   Married Monica Salazar    1 kid Monica Salazar Daughter   And 1 stepkid and 1 adopted kid    Retired Scientist, water quality    Owns guns, wears seat belt, safe in relationship    Lives in [place], [place] and [place] in past    Social Determinants of Health   Financial Resource Strain: Monica Salazar  (10/10/2021)   Overall Financial Resource Strain (CARDIA)    Difficulty of Paying Living Expenses: Not hard at all  Food Insecurity: No Food Insecurity (10/10/2021)   Hunger Vital Sign    Worried About Running Out of Food in the Last Year:  Never true    Monica Salazar in the Last Year: Never true  Transportation Needs: No Transportation Needs (10/10/2021)   PRAPARE - Hydrologist (Medical): No    Lack of Transportation (Non-Medical): No  Physical Activity: Sufficiently Active (10/10/2021)   Exercise Vital Sign    Days of Exercise per Week: 6 days    Minutes of Exercise per Session: 30 min  Stress: No Stress Concern Present (10/10/2021)   [place]    Feeling of Stress : Not at all  Social Connections: Unknown (10/10/2021)   Social Connection and Isolation Panel [NHANES]    Frequency of Communication with Friends and Family: Not on file    Frequency of Social Gatherings with Friends and Family: Not on file    Attends Religious Services: Not on file    Active Member of Clubs or Organizations: Not on file    Attends Archivist Meetings: Not on file    Marital Status: Married    Tobacco Counseling Counseling given: Not Answered   Clinical Intake:  Pre-visit preparation completed: Yes        Diabetes: No  How often do you need to have someone help you when you read instructions, pamphlets, or other written materials from your doctor or pharmacy?: 1 - Never   Interpreter Needed?: No      Activities of Daily Living    10/10/2021   10:09 AM  In your present state of health, do you have any difficulty performing the following activities:  Hearing? 1  Comment Followed by ENT  Vision? 0  Difficulty concentrating or making decisions? 0  Walking or climbing stairs? 0  Dressing or bathing? 0  Doing errands, shopping? 0  Preparing Food and eating ? N  Using the Toilet? N  In the past six months, have you accidently leaked urine? N  Do you have problems with loss of bowel control? N  Managing your Medications? N  Managing your Finances? N  Housekeeping or managing your Housekeeping? N    Patient Care  Team: McLean-Scocuzza, Monica Glow, MD as PCP - General (Internal Medicine)  Indicate any recent Medical Services you may have received from other than [facility] providers in the past year (date may be approximate).     Assessment:   This is a routine wellness examination for Monica Salazar.  I connected with  Monica Salazar  on 10/10/21 by a audio enabled telemedicine application and verified that I am speaking with the correct person using two identifiers.  Patient Location: Home  Provider Location: Office/Clinic  I discussed the limitations of evaluation and management by telemedicine. The patient expressed understanding and agreed to proceed.   Hearing/Vision screen Hearing Screening - Comments:: Followed by [facility]  Hearing loss L ear.  She wears a microphone on her left ear to pick up sound from the left side and direct to the right ear. (cross hearing aid). Vision Screening - Comments:: Followed by Dr. Wallace [facility]). Wears corrective lenses  They have seen their ophthalmologist in the last 12 months.  Dietary issues and exercise activities discussed: Current Exercise Habits: Home exercise routine, Type of exercise: walking;calisthenics, Time (Minutes): 30, Frequency (Times/Week): 6, Weekly Exercise (Minutes/Week): 180, Intensity: Mild Healthy diet Good water intake   Goals Addressed   None    Depression Screen    10/10/2021   10:09 AM 10/01/2021    2:10 PM 06/14/2020    9:09 AM 10/03/2019    9:42 AM 05/24/2019   10:39 AM 03/25/2019    9:05 AM 09/30/2018    9:54 AM  PHQ 2/9 Scores  PHQ - 2 Score 0 0 0 0 0 0 0    Fall Risk    10/10/2021   10:09 AM 10/01/2021    2:10 PM 06/14/2020    9:09 AM 10/03/2019    9:44 AM 05/24/2019   10:39 AM  Fall Risk   Falls in the past year? 0 0 0 0 0  Number falls in past yr: 0 0 0 0 0  Injury with Fall? 0 0   0  Risk for fall due to : No Fall Risks No Fall Risks     Follow up Falls evaluation completed Falls evaluation completed  Falls evaluation completed Falls evaluation completed Falls evaluation completed    [place]: Home free of loose throw rugs in walkways, pet beds, electrical cords, etc? Yes  Adequate lighting in your home to reduce risk of falls? Yes   ASSISTIVE DEVICES UTILIZED TO PREVENT FALLS: Life alert? No  Use of a cane, walker or w/c? No   TIMED UP AND GO: Was the test performed? No .   Cognitive Function:  Patient is alert and oriented x3.  Enjoys brain stimulating activities like reading, online games and more. 100% independent.  Denies difficulty with memory loss, making decisions, focusing.       10/10/2021   10:13 AM 09/30/2018    9:59 AM  6CIT Screen  What Year? 0 points 0 points  What month? 0 points 0 points  What time? 0 points 0 points  Count back from 20  0 points  Months in reverse 0 points 0 points  Repeat phrase  0 points  Total Score  0 points    Immunizations Immunization History  Administered Date(s) Administered   Fluad Quad(high Dose 65+) 09/04/2018, 09/23/2021   Influenza, High Dose Seasonal PF 10/21/2017   Influenza,inj,Quad PF,6+ Mos 10/26/2019   PFIZER(Purple Top)SARS-COV-2 Vaccination 02/13/2019, 03/10/2019, 10/20/2019, 06/05/2020   Pfizer Covid-19 Vaccine Bivalent Booster 13yr & up 09/29/2020, 09/23/2021   TDAP status: Due, Education has been provided regarding the importance of this vaccine. Advised may receive this vaccine at local pharmacy or Health Dept. Aware to provide a copy of the vaccination record if obtained from local pharmacy or Health Dept. Verbalized acceptance and understanding.  Pneumococcal vaccine  status: Due, Education has been provided regarding the importance of this vaccine. Advised may receive this vaccine at local pharmacy or Health Dept. Aware to provide a copy of the vaccination record if obtained from local pharmacy or Health Dept. Verbalized acceptance and understanding.  Covid-19 vaccine  status: Completed vaccines x6.  Shingrix Completed?: No.    Education has been provided regarding the importance of this vaccine. Patient has been advised to call insurance company to determine out of pocket expense if they have not yet received this vaccine. Advised may also receive vaccine at local pharmacy or Health Dept. Verbalized acceptance and understanding.  Screening Tests Health Maintenance  Topic Date Due   Pneumonia Vaccine 29+ Years old (1 - PCV) 12/06/2021 (Originally 01/20/2016)   MAMMOGRAM  12/06/2021 (Originally 03/20/2021)   TETANUS/TDAP  12/06/2021 (Originally 01/19/1969)   Zoster Vaccines- Shingrix (1 of 2) 01/10/2022 (Originally [birth year]19/1969)   COVID-19 Vaccine (7 - Pfizer risk series) 11/18/2021   Fecal DNA (Cologuard)  12/11/2023   INFLUENZA VACCINE  Completed   DEXA SCAN  Completed   Hepatitis C Screening  Completed   HPV VACCINES  Aged Out   Health Maintenance There are no preventive care reminders to display for this patient.  Mammogram- deferred per patient preference.   Bone density- 2021  Lung Cancer Screening: (Low Dose CT Chest recommended if Age 67-80 years, 30 pack-year currently smoking OR have quit w/in 15years.) does not qualify.   Hepatitis C Screening: Completed 2019.   Vision Screening: Recommended annual ophthalmology exams for early detection of glaucoma and other disorders of the eye.  Dental Screening: Recommended annual dental exams for proper oral hygiene  Community Resource Referral / Chronic Care Management: CRR required this visit?  No   CCM required this visit?  No      Plan:     I have personally reviewed and noted the following in the patient's chart:   Medical and social history Use of alcohol, tobacco or illicit drugs  Current medications and supplements including opioid prescriptions. Patient is not currently taking opioid prescriptions. Functional ability and status Nutritional status Physical activity Advanced  directives List of other physicians Hospitalizations, surgeries, and ER visits in previous 12 months Vitals Screenings to include cognitive, depression, and falls Referrals and appointments  In addition, I have reviewed and discussed with patient certain preventive protocols, quality metrics, and best practice recommendations. A written personalized care plan for preventive services as well as general preventive health recommendations were provided to patient.     Varney Biles, LPN   [id number]

## 2021-10-14 ENCOUNTER — Other Ambulatory Visit (INDEPENDENT_AMBULATORY_CARE_PROVIDER_SITE_OTHER): Payer: MEDICARE

## 2021-10-14 DIAGNOSIS — M47812 Spondylosis without myelopathy or radiculopathy, cervical region: Secondary | ICD-10-CM

## 2021-10-14 DIAGNOSIS — Z0000 Encounter for general adult medical examination without abnormal findings: Secondary | ICD-10-CM | POA: Diagnosis not present

## 2021-10-14 DIAGNOSIS — E785 Hyperlipidemia, unspecified: Secondary | ICD-10-CM

## 2021-10-14 LAB — CBC WITH DIFFERENTIAL/PLATELET
Basophils Absolute: 0 10*3/uL (ref 0.0–0.1)
Basophils Relative: 1 % (ref 0.0–3.0)
Eosinophils Absolute: 0.1 10*3/uL (ref 0.0–0.7)
Eosinophils Relative: 3.2 % (ref 0.0–5.0)
HCT: 38.2 % (ref 36.0–46.0)
Hemoglobin: 13.2 g/dL (ref 12.0–15.0)
Lymphocytes Relative: 38 % (ref 12.0–46.0)
Lymphs Abs: 1.7 10*3/uL (ref 0.7–4.0)
MCHC: 34.6 g/dL (ref 30.0–36.0)
MCV: 90.4 fl (ref 78.0–100.0)
Monocytes Absolute: 0.4 10*3/uL (ref 0.1–1.0)
Monocytes Relative: 9.5 % (ref 3.0–12.0)
Neutro Abs: 2.1 10*3/uL (ref 1.4–7.7)
Neutrophils Relative %: 48.3 % (ref 43.0–77.0)
Platelets: 279 10*3/uL (ref 150.0–400.0)
RBC: 4.22 Mil/uL (ref 3.87–5.11)
RDW: 13 % (ref 11.5–15.5)
WBC: 4.4 10*3/uL (ref 4.0–10.5)

## 2021-10-14 LAB — COMPREHENSIVE METABOLIC PANEL
ALT: 15 U/L (ref 0–35)
AST: 21 U/L (ref 0–37)
Albumin: 3.9 g/dL (ref 3.5–5.2)
Alkaline Phosphatase: 58 U/L (ref 39–117)
BUN: 10 mg/dL (ref 6–23)
CO2: 28 mEq/L (ref 19–32)
Calcium: 8.7 mg/dL (ref 8.4–10.5)
Chloride: 106 mEq/L (ref 96–112)
Creatinine, Ser: 0.63 mg/dL (ref 0.40–1.20)
GFR: 89.68 mL/min (ref >60.00)
Glucose, Bld: 100 mg/dL — ABNORMAL HIGH (ref 70–99)
Potassium: 3.8 mEq/L (ref 3.5–5.1)
Sodium: 141 mEq/L (ref 135–145)
Total Bilirubin: 0.8 mg/dL (ref 0.2–1.2)
Total Protein: 6.3 g/dL (ref 6.0–8.3)

## 2021-10-14 LAB — LIPID PANEL
Cholesterol: 224 mg/dL — ABNORMAL HIGH (ref 0–200)
HDL: 57.8 mg/dL (ref >39.00)
LDL Cholesterol: 142 mg/dL — ABNORMAL HIGH (ref 0–99)
NonHDL: 166.66
Total CHOL/HDL Ratio: 4
Triglycerides: 125 mg/dL (ref 0.0–149.0)
VLDL: 25 mg/dL (ref 0.0–40.0)

## 2021-10-15 ENCOUNTER — Telehealth: Payer: Self-pay

## 2021-10-15 NOTE — Telephone Encounter (Signed)
LMOM for pt to CB in regards to labs 

## 2021-10-16 ENCOUNTER — Telehealth: Payer: Self-pay

## 2021-10-16 NOTE — Telephone Encounter (Signed)
2nd attempt to reach pt in regards to lab results. Pt has seen results on mychart but Dr. Olivia Mackie had a question that we would like to discuss with pt. LMOM to CB

## 2021-10-16 NOTE — Telephone Encounter (Signed)
Patient does not want a prescription drug for her cholesterol. She wants to over the counter Otc nature made cholestoff .

## 2021-10-17 ENCOUNTER — Encounter: Payer: Self-pay | Admitting: Internal Medicine

## 2021-10-17 ENCOUNTER — Ambulatory Visit (INDEPENDENT_AMBULATORY_CARE_PROVIDER_SITE_OTHER): Payer: MEDICARE | Admitting: Internal Medicine

## 2021-10-17 VITALS — BP 126/62 | HR 65 | Temp 98.4°F | Ht 60.0 in | Wt 136.4 lb

## 2021-10-17 DIAGNOSIS — Z23 Encounter for immunization: Secondary | ICD-10-CM

## 2021-10-17 DIAGNOSIS — E785 Hyperlipidemia, unspecified: Secondary | ICD-10-CM | POA: Diagnosis not present

## 2021-10-17 DIAGNOSIS — R200 Anesthesia of skin: Secondary | ICD-10-CM | POA: Diagnosis not present

## 2021-10-17 DIAGNOSIS — Z1231 Encounter for screening mammogram for malignant neoplasm of breast: Secondary | ICD-10-CM

## 2021-10-17 DIAGNOSIS — R202 Paresthesia of skin: Secondary | ICD-10-CM | POA: Diagnosis not present

## 2021-10-17 DIAGNOSIS — Z2911 Encounter for prophylactic immunotherapy for respiratory syncytial virus (RSV): Secondary | ICD-10-CM | POA: Diagnosis not present

## 2021-10-17 DIAGNOSIS — G5601 Carpal tunnel syndrome, right upper limb: Secondary | ICD-10-CM | POA: Diagnosis not present

## 2021-10-17 MED ORDER — RSVPREF3 VAC RECOMB ADJUVANTED 120 MCG/0.5ML IM SUSR: 0.5000 mL | Freq: Once | INTRAMUSCULAR | 0 refills | Status: AC

## 2021-10-17 NOTE — Progress Notes (Signed)
Chief Complaint  Patient presents with   Annual Exam   F/u   Hld disc diet changes and exercises   C/o right hand 2nd and 3rd fingers tingling with h/o CTS will refer neurology    Review of Systems  Constitutional:  Negative for weight loss.  HENT:  Negative for hearing loss.   Eyes:  Negative for blurred vision.  Respiratory:  Negative for shortness of breath.   Cardiovascular:  Negative for chest pain.  Gastrointestinal:  Negative for abdominal pain and blood in stool.  Genitourinary:  Negative for dysuria.  Musculoskeletal:  Negative for falls and joint pain.  Skin:  Negative for rash.  Neurological:  Positive for tingling. Negative for headaches.  Psychiatric/Behavioral:  Negative for depression.    Past Medical History:  Diagnosis Date   Arthritis    hands   COVID-19    09/2021   Hearing loss    left ear 09/27/16 MR, CT negative etiology she had rash neck 2 days before hearing loss left ear so ? virus   History of chicken pox    Migraine    tries Excedrine migraine-normally sees swigly lines/distorted vision unable to talk, left arm recently numb; senstive light and sound   Osteoporosis    UTI (urinary tract infection)    Vitamin D deficiency    Past Surgical History:  Procedure Laterality Date   [place]   hospitalization     2006 untx'ed UTI   TONSILLECTOMY  1966   Family History  Problem Relation Age of Onset   Lung cancer Mother    Early death Mother    Cancer Mother        lung cancer smoker    Heart attack Father    Stroke Father    Heart disease Father    Arthritis Brother        rheumatoid   Migraines Brother    Migraines Daughter    Diabetes Maternal Grandmother        died comp. DM 2    Early death Maternal Grandmother    Hypertension Maternal Grandmother    Early death Paternal Grandfather    Hearing loss Paternal Grandfather    Social History   Socioeconomic History   Marital status: Married     Spouse name: Not on file   Number of children: Not on file   Years of education: Not on file   Highest education level: Not on file  Occupational History   Not on file  Tobacco Use   Smoking status: Never   Smokeless tobacco: Never  Vaping Use   Vaping Use: Never used  Substance and Sexual Activity   Alcohol use: Never   Drug use: Never   Sexual activity: Yes  Other Topics Concern   Not on file  Social History Narrative   Married [name]    1 kid [name] Daughter   And 1 stepkid and 1 adopted kid    Retired Scientist, water quality    Owns guns, wears seat belt, safe in relationship    Lives in [place], [place] and [place] in past    Social Determinants of Health   Financial Resource Strain: [place]  (10/10/2021)   Overall Financial Resource Strain (CARDIA)    Difficulty of Paying Living Expenses: Not hard at all  Food Insecurity: No Food Insecurity (10/10/2021)   Hunger Vital Sign    Worried About Estate manager/land agent of Food  in the Last Year: Never true    [place] in the Last Year: Never true  Transportation Needs: No Transportation Needs (10/10/2021)   PRAPARE - Hydrologist (Medical): No    Lack of Transportation (Non-Medical): No  Physical Activity: Sufficiently Active (10/10/2021)   Exercise Vital Sign    Days of Exercise per Week: 6 days    Minutes of Exercise per Session: 30 min  Stress: No Stress Concern Present (10/10/2021)   Leslie    Feeling of Stress : Not at all  Social Connections: Unknown (10/10/2021)   Social Connection and Isolation Panel [NHANES]    Frequency of Communication with Friends and Family: Not on file    Frequency of Social Gatherings with Friends and Family: Not on file    Attends Religious Services: Not on file    Active Member of Clubs or Organizations: Not on file    Attends Archivist Meetings: Not on file    Marital Status: Married   Intimate Partner Violence: Not At Risk (10/10/2021)   Humiliation, Afraid, Rape, and Kick questionnaire    Fear of Current or Ex-Partner: No    Emotionally Abused: No    Physically Abused: No    Sexually Abused: No   Current Meds  Medication Sig   Cholecalciferol (VITAMIN D3) 50 MCG (2000 UT) TABS Take by mouth.   cyanocobalamin 1000 MCG tablet Take 1,000 mcg by mouth daily.   diclofenac sodium (VOLTAREN) 1 % GEL Apply topically every 14 (fourteen) days.   RSV vaccine recomb adjuvanted (AREXVY) 120 MCG/0.5ML injection Inject 0.5 mLs into the muscle once for 1 dose.   Turmeric (QC TUMERIC COMPLEX PO) Take by mouth.   Allergies  Allergen Reactions   Boniva [Ibandronic Acid]     Jaw pain    Recent Results (from the past 2160 hour(s))  CBC with Differential/Platelet     Status: None   Collection Time: 10/14/21  7:34 AM  Result Value Ref Range   WBC 4.4 4.0 - 10.5 K/uL   RBC 4.22 3.87 - 5.11 Mil/uL   Hemoglobin 13.2 12.0 - 15.0 g/dL   HCT 38.2 36.0 - 46.0 %   MCV 90.4 78.0 - 100.0 fl   MCHC 34.6 30.0 - 36.0 g/dL   RDW 13.0 11.5 - 15.5 %   Platelets 279.0 150.0 - 400.0 K/uL   Neutrophils Relative % 48.3 43.0 - 77.0 %   Lymphocytes Relative 38.0 12.0 - 46.0 %   Monocytes Relative 9.5 3.0 - 12.0 %   Eosinophils Relative 3.2 0.0 - 5.0 %   Basophils Relative 1.0 0.0 - 3.0 %   Neutro Abs 2.1 1.4 - 7.7 K/uL   Lymphs Abs 1.7 0.7 - 4.0 K/uL   Monocytes Absolute 0.4 0.1 - 1.0 K/uL   Eosinophils Absolute 0.1 0.0 - 0.7 K/uL   Basophils Absolute 0.0 0.0 - 0.1 K/uL  Lipid panel     Status: Abnormal   Collection Time: 10/14/21  7:34 AM  Result Value Ref Range   Cholesterol 224 (H) 0 - 200 mg/dL    Comment: ATP III Classification       Desirable:  < 200 mg/dL               Borderline High:  200 - 239 mg/dL          High:  > = 240 mg/dL   Triglycerides 125.0 0.0 -  149.0 mg/dL    Comment: Normal:  <150 mg/dLBorderline High:  150 - 199 mg/dL   HDL 57.80 >39.00 mg/dL   VLDL 25.0 0.0 - 40.0  mg/dL   LDL Cholesterol 142 (H) 0 - 99 mg/dL   Total CHOL/HDL Ratio 4     Comment:                Men          Women1/2 Average Risk     3.4          3.3Average Risk          5.0          4.42X Average Risk          9.6          7.13X Average Risk          15.0          11.0                       NonHDL 166.66     Comment: NOTE:  Non-HDL goal should be 30 mg/dL higher than patient's LDL goal (i.e. LDL goal of < 70 mg/dL, would have non-HDL goal of < 100 mg/dL)  Comprehensive metabolic panel     Status: Abnormal   Collection Time: 10/14/21  7:34 AM  Result Value Ref Range   Sodium 141 135 - 145 mEq/L   Potassium 3.8 3.5 - 5.1 mEq/L   Chloride 106 96 - 112 mEq/L   CO2 28 19 - 32 mEq/L   Glucose, Bld 100 (H) 70 - 99 mg/dL   BUN 10 6 - 23 mg/dL   Creatinine, Ser 0.63 0.40 - 1.20 mg/dL   Total Bilirubin 0.8 0.2 - 1.2 mg/dL   Alkaline Phosphatase 58 39 - 117 U/L   AST 21 0 - 37 U/L   ALT 15 0 - 35 U/L   Total Protein 6.3 6.0 - 8.3 g/dL   Albumin 3.9 3.5 - 5.2 g/dL   GFR 89.68 >60.00 mL/min    Comment: Calculated using the CKD-EPI Creatinine Equation (2021)   Calcium 8.7 8.4 - 10.5 mg/dL   Objective  Body mass index is 26.64 kg/m. Wt Readings from Last 3 Encounters:  10/17/21 136 lb 6.4 oz (61.9 kg)  10/10/21 134 lb (60.8 kg)  10/01/21 134 lb (60.8 kg)   Temp Readings from Last 3 Encounters:  10/17/21 98.4 F (36.9 C) (Oral)  10/01/21 (!) 100.5 F (38.1 C) (Oral)  03/05/21 98 F (36.7 C) (Oral)   BP Readings from Last 3 Encounters:  10/17/21 126/62  03/05/21 128/78  06/14/20 110/70   Pulse Readings from Last 3 Encounters:  10/17/21 65  03/05/21 67  06/14/20 68    Physical Exam Vitals and nursing note reviewed.  Constitutional:      Appearance: Normal appearance. She is well-developed and well-groomed.  HENT:     Head: Normocephalic and atraumatic.  Eyes:     Conjunctiva/sclera: Conjunctivae normal.     Pupils: Pupils are equal, round, and reactive to light.   Cardiovascular:     Rate and Rhythm: Normal rate and regular rhythm.     Heart sounds: Normal heart sounds. No murmur heard. Pulmonary:     Effort: Pulmonary effort is normal.     Breath sounds: Normal breath sounds.  Abdominal:     General: Abdomen is flat. Bowel sounds are normal.     Tenderness: There is no  abdominal tenderness.  Musculoskeletal:        General: No tenderness.  Skin:    General: Skin is warm and dry.  Neurological:     General: No focal deficit present.     Mental Status: She is alert and oriented to person, place, and time. Mental status is at baseline.     Cranial Nerves: Cranial nerves 2-12 are intact.     Motor: Motor function is intact.     Coordination: Coordination is intact.     Gait: Gait is intact.  Psychiatric:        Attention and Perception: Attention and perception normal.        Mood and Affect: Mood and affect normal.        Speech: Speech normal.        Behavior: Behavior normal. Behavior is cooperative.        Thought Content: Thought content normal.        Cognition and Memory: Cognition and memory normal.        Judgment: Judgment normal.     Assessment  Plan  Hyperlipidemia, unspecified hyperlipidemia type   Numbness and tingling in right hand - Plan: Ambulatory referral to Neurology Carpal tunnel syndrome of right wrist - Plan: Ambulatory referral to Neurology  Dr. Manuella Ghazi   HM Flu shot utd Tdap 2017 per pt   covid 19 had 4/4    Declines shingrix Prevnar 20 given today RSV vaccine rx given today   hcv negative MMR immune    Never smoker   Get records pap (h/o normal per pt), mammo due 03/2018 per pt, colonoscopy 2011, mammogram, DEXA h/o osteoporosis (see above was given Boniva in the past noted 12/08/16), vaccines (Tdap, pna vaccines, zostervax)  Had zostavax x 1 declines shingrix for now    former PCP Dr. Shellee Milo in [place] [phone]    Out of age window pap  03/21/19 DEXA with h/o osteoporosis declines prolia   Mammogram 03/21/19 negative pt wants to wait until 03/19/21   12/07/20 negative cologuard repeat in 3 years  -declines further colonoscopies had in ? 2011 no h/o polyps and no FH colon cancer    2x dermatology mid back bx (left mid back). 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 Manuella Ghazi 03/19/18 Neurology   Provider: Dr. Olivia Mackie McLean-Scocuzza-[facility] Medicine

## 2021-10-17 NOTE — Patient Instructions (Addendum)
Red yeast rice  [employer] made [employer] and sch mammogram  [facility] neurology  Phone Fax E-mail Address  [phone] Not available [facility]   [place] [address]     Specialties     Neurology       Carpal Tunnel Syndrome  Carpal tunnel syndrome is a condition that causes pain, numbness, and weakness in your hand and fingers. The carpal tunnel is a narrow area located on the palm side of your wrist. Repeated wrist motion or certain diseases may cause swelling within the tunnel. This swelling pinches the main nerve in the wrist. The main nerve in the wrist is called the median nerve. What are the causes? This condition may be caused by: Repeated and forceful wrist and hand motions. Wrist injuries. Arthritis. A cyst or tumor in the carpal tunnel. Fluid buildup during pregnancy. Use of tools that vibrate. Sometimes the cause of this condition is not known. What increases the risk? The following factors may make you more likely to develop this condition: Having a job that requires you to repeatedly or forcefully move your wrist or hand or requires you to use tools that vibrate. This may include jobs that involve using computers, working on an [employer], or working with [place] such as Pension scheme manager. Being a woman. Having certain conditions, such as: Diabetes. Obesity. An underactive thyroid (hypothyroidism). Kidney failure. Rheumatoid arthritis. What are the signs or symptoms? Symptoms of this condition include: A tingling feeling in your fingers, especially in your thumb, index, and middle fingers. Tingling or numbness in your hand. An aching feeling in your entire arm, especially when your wrist and elbow are bent for a long time. Wrist pain that goes up your arm to your shoulder. Pain that goes down into your palm or fingers. A weak feeling in your hands. You may have trouble grabbing and holding  items. Your symptoms may feel worse during the night. How is this diagnosed? This condition is diagnosed with a medical history and physical exam. You may also have tests, including: Electromyogram (EMG). This test measures electrical signals sent by your nerves into the muscles. Nerve conduction study. This test measures how well electrical signals pass through your nerves. Imaging tests, such as X-rays, ultrasound, and MRI. These tests check for possible causes of your condition. How is this treated? This condition may be treated with: Lifestyle changes. It is important to stop or change the activity that caused your condition. Doing exercise and activities to strengthen and stretch your muscles and tendons (physical therapy). Making lifestyle changes to help with your condition and learning how to do your daily activities safely (occupational therapy). Medicines for pain and inflammation. This may include medicine that is injected into your wrist. A wrist splint or brace. Surgery. Follow these instructions at home: If you have a splint or brace: Wear the splint or brace as told by your health care provider. Remove it only as told by your health care provider. Loosen the splint or brace if your fingers tingle, become numb, or turn cold and blue. Keep the splint or brace clean. If the splint or brace is not waterproof: Do not let it get wet. Cover it with a watertight covering when you take a bath or shower. Managing pain, stiffness, and swelling If directed, put ice on the painful area. To do this: If you have a removeable splint or brace, remove it as told by your  health care provider. Put ice in a plastic bag. Place a towel between your skin and the bag or between the splint or brace and the bag. Leave the ice on for 20 minutes, 2-3 times a day. Do not fall asleep with the cold pack on your skin. Remove the ice if your skin turns bright red. This is very important. If you cannot feel  pain, heat, or cold, you have a greater risk of damage to the area. Move your fingers often to reduce stiffness and swelling. General instructions Take over-the-counter and prescription medicines only as told by your health care provider. Rest your wrist and hand from any activity that may be causing your pain. If your condition is work related, talk with your employer about changes that can be made, such as getting a wrist pad to use while typing. Do any exercises as told by your health care provider, physical therapist, or occupational therapist. Keep all follow-up visits. This is important. Contact a health care provider if: You have new symptoms. Your pain is not controlled with medicines. Your symptoms get worse. Get help right away if: You have severe numbness or tingling in your wrist or hand. Summary Carpal tunnel syndrome is a condition that causes pain, numbness, and weakness in your hand and fingers. It is usually caused by repeated wrist motions. Lifestyle changes and medicines are used to treat carpal tunnel syndrome. Surgery may be recommended. Follow your health care provider's instructions about wearing a splint, resting from activity, keeping follow-up visits, and calling for help. This information is not intended to replace advice given to you by your health care provider. Make sure you discuss any questions you have with your health care provider. Document Revised: 05/05/2019 Document Reviewed: 05/05/2019 Elsevier Patient Education  [place].  Pneumococcal Conjugate Vaccine (Prevnar 20) Suspension for Injection What is this medication? PNEUMOCOCCAL VACCINE (NEU mo KOK al vak SEEN) is a vaccine. It prevents pneumococcus bacterial infections. These bacteria can cause serious infections like pneumonia, meningitis, and blood infections. This vaccine will not treat an infection and will not cause infection. This vaccine is recommended for adults 18 years and  older. This medicine may be used for other purposes; ask your health care provider or pharmacist if you have questions. COMMON BRAND NAME(S): Prevnar 20 What should I tell my care team before I take this medication? They need to know if you have any of these conditions: bleeding disorder fever immune system problems an unusual or allergic reaction to pneumococcal vaccine, diphtheria toxoid, other vaccines, other medicines, foods, dyes, or preservatives pregnant or trying to get pregnant breast-feeding How should I use this medication? This vaccine is injected into a muscle. It is given by a health care provider. A copy of Vaccine Information Statements will be given before each vaccination. Be sure to read this information carefully each time. This sheet may change often. Talk to your health care provider about the use of this medicine in children. Special care may be needed. Overdosage: If you think you have taken too much of this medicine contact a poison control center or emergency room at once. NOTE: This medicine is only for you. Do not share this medicine with others. What if I miss a dose? This does not apply. This medicine is not for regular use. What may interact with this medication? medicines for cancer chemotherapy medicines that suppress your immune function steroid medicines like prednisone or cortisone This list may not describe all possible interactions. Give your  health care provider a list of all the medicines, herbs, non-prescription drugs, or dietary supplements you use. Also tell them if you smoke, drink alcohol, or use illegal drugs. Some items may interact with your medicine. What should I watch for while using this medication? Mild fever and pain should go away in 3 days or less. Report any unusual symptoms to your health care provider. What side effects may I notice from receiving this medication? Side effects that you should report to your doctor or health care  professional as soon as possible: allergic reactions (skin rash, itching or hives; swelling of the face, lips, or tongue) confusion fast, irregular heartbeat fever over 102 degrees F muscle weakness seizures trouble breathing unusual bruising or bleeding Side effects that usually do not require medical attention (report to your doctor or health care professional if they continue or are bothersome): fever of 102 degrees F or less headache joint pain muscle cramps, pain pain, tender at site where injected This list may not describe all possible side effects. Call your doctor for medical advice about side effects. You may report side effects to FDA at 1-800-FDA-1088. Where should I keep my medication? This vaccine is only given by a health care provider. It will not be stored at home. NOTE: This sheet is a summary. It may not cover all possible information. If you have questions about this medicine, talk to your doctor, pharmacist, or health care provider.  2023 Elsevier/Gold Standard (2019-08-26 00:00:00)  High Cholesterol  High cholesterol is a condition in which the blood has high levels of a white, waxy substance similar to fat (cholesterol). The liver makes all the cholesterol that the body needs. The human body needs small amounts of cholesterol to help build cells. A person gets extra or excess cholesterol from the food that he or she eats. The blood carries cholesterol from the liver to the rest of the body. If you have high cholesterol, deposits (plaques) may build up on the walls of your arteries. Arteries are the blood vessels that carry blood away from your heart. These plaques make the arteries narrow and stiff. Cholesterol plaques increase your risk for heart attack and stroke. Work with your health care provider to keep your cholesterol levels in a healthy range. What increases the risk? The following factors may make you more likely to develop this condition: Eating foods  that are high in animal fat (saturated fat) or cholesterol. Being overweight. Not getting enough exercise. A family history of high cholesterol (familial hypercholesterolemia). Use of tobacco products. Having diabetes. What are the signs or symptoms? In most cases, high cholesterol does not usually cause any symptoms. In severe cases, very high cholesterol levels can cause: Fatty bumps under the skin (xanthomas). A white or gray ring around the black center (pupil) of the eye. How is this diagnosed? This condition may be diagnosed based on the results of a blood test. If you are older than 70 years of age, your health care provider may check your cholesterol levels every 4-6 years. You may be checked more often if you have high cholesterol or other risk factors for heart disease. The blood test for cholesterol measures: "Bad" cholesterol, or LDL cholesterol. This is the main type of cholesterol that causes heart disease. The desired level is less than 100 mg/dL (2.59 mmol/L). "Good" cholesterol, or HDL cholesterol. HDL helps protect against heart disease by cleaning the arteries and carrying the LDL to the liver for processing. The desired level for HDL  is 60 mg/dL (1.55 mmol/L) or higher. Triglycerides. These are fats that your body can store or burn for energy. The desired level is less than 150 mg/dL (1.69 mmol/L). Total cholesterol. This measures the total amount of cholesterol in your blood and includes LDL, HDL, and triglycerides. The desired level is less than 200 mg/dL (5.17 mmol/L). How is this treated? Treatment for high cholesterol starts with lifestyle changes, such as diet and exercise. Diet changes. You may be asked to eat foods that have more fiber and less saturated fats or added sugar. Lifestyle changes. These may include regular exercise, maintaining a healthy weight, and quitting use of tobacco products. Medicines. These are given when diet and lifestyle changes have not  worked. You may be prescribed a statin medicine to help lower your cholesterol levels. Follow these instructions at home: Eating and drinking  Eat a healthy, balanced diet. This diet includes: Daily servings of a variety of fresh, frozen, or canned fruits and vegetables. Daily servings of whole grain foods that are rich in fiber. Foods that are low in saturated fats and trans fats. These include poultry and fish without skin, lean cuts of meat, and low-fat dairy products. A variety of fish, especially oily fish that contain omega-3 fatty acids. Aim to eat fish at least 2 times a week. Avoid foods and drinks that have added sugar. Use healthy cooking methods, such as roasting, grilling, broiling, baking, poaching, steaming, and stir-frying. Do not fry your food except for stir-frying. If you drink alcohol: Limit how much you have to: 0-1 drink a day for women who are not pregnant. 0-2 drinks a day for men. Know how much alcohol is in a drink. In the [place] one drink equals one 12 oz bottle of beer (355 mL), one 5 oz glass of wine (148 mL), or one 1 oz glass of hard liquor (44 mL). Lifestyle  Get regular exercise. Aim to exercise for a total of 150 minutes a week. Increase your activity level by doing activities such as gardening, walking, and taking the stairs. Do not use any products that contain nicotine or tobacco. These products include cigarettes, chewing tobacco, and vaping devices, such as e-cigarettes. If you need help quitting, ask your health care provider. General instructions Take over-the-counter and prescription medicines only as told by your health care provider. Keep all follow-up visits. This is important. Where to find more information American Heart Association: www.heart.org National Heart, Lung, and Blood Institute: https://wilson-eaton.com/ Contact a health care provider if: You have trouble achieving or maintaining a healthy diet or weight. You are starting an exercise  program. You are unable to stop smoking. Get help right away if: You have chest pain. You have trouble breathing. You have discomfort or pain in your jaw, neck, back, shoulder, or arm. You have any symptoms of a stroke. "BE FAST" is an easy way to remember the main warning signs of a stroke: B - Balance. Signs are dizziness, sudden trouble walking, or loss of balance. E - Eyes. Signs are trouble seeing or a sudden change in vision. F - Face. Signs are sudden weakness or numbness of the face, or the face or eyelid drooping on one side. A - Arms. Signs are weakness or numbness in an arm. This happens suddenly and usually on one side of the body. S - Speech. Signs are sudden trouble speaking, slurred speech, or trouble understanding what people say. T - Time. Time to call emergency services. Write down what time symptoms started.  You have other signs of a stroke, such as: A sudden, severe headache with no known cause. Nausea or vomiting. Seizure. These symptoms may represent a serious problem that is an emergency. Do not wait to see if the symptoms will go away. Get medical help right away. Call your local emergency services (911 in the [place] Do not drive yourself to the hospital. Summary Cholesterol plaques increase your risk for heart attack and stroke. Work with your health care provider to keep your cholesterol levels in a healthy range. Eat a healthy, balanced diet, get regular exercise, and maintain a healthy weight. Do not use any products that contain nicotine or tobacco. These products include cigarettes, chewing tobacco, and vaping devices, such as e-cigarettes. Get help right away if you have any symptoms of a stroke. This information is not intended to replace advice given to you by your health care provider. Make sure you discuss any questions you have with your health care provider. Document Revised: 03/08/2020 Document Reviewed: 02/27/2020 Elsevier Patient Education  [place].  Cholesterol Content in Foods Cholesterol is a waxy, fat-like substance that helps to carry fat in the blood. The body needs cholesterol in small amounts, but too much cholesterol can cause damage to the arteries and heart. What foods have cholesterol?  Cholesterol is found in animal-based foods, such as meat, seafood, and dairy. Generally, low-fat dairy and lean meats have less cholesterol than full-fat dairy and fatty meats. The milligrams of cholesterol per serving (mg per serving) of common cholesterol-containing foods are listed below. Meats and other proteins Egg -- one large whole egg has 186 mg. Veal shank -- 4 oz (113 g) has 141 mg. Lean ground [place] (93% lean) -- 4 oz (113 g) has 118 mg. Fat-trimmed lamb loin -- 4 oz (113 g) has 106 mg. Lean ground beef (90% lean) -- 4 oz (113 g) has 100 mg. Lobster -- 3.5 oz (99 g) has 90 mg. Pork loin chops -- 4 oz (113 g) has 86 mg. Canned salmon -- 3.5 oz (99 g) has 83 mg. Fat-trimmed beef top loin -- 4 oz (113 g) has 78 mg. Frankfurter -- 1 frank (3.5 oz or 99 g) has 77 mg. Crab -- 3.5 oz (99 g) has 71 mg. Roasted chicken without skin, white meat -- 4 oz (113 g) has 66 mg. Light bologna -- 2 oz (57 g) has 45 mg. Deli-cut [place] -- 2 oz (57 g) has 31 mg. Canned tuna -- 3.5 oz (99 g) has 31 mg. Berniece Salines -- 1 oz (28 g) has 29 mg. Oysters and mussels (raw) -- 3.5 oz (99 g) has 25 mg. Mackerel -- 1 oz (28 g) has 22 mg. Trout -- 1 oz (28 g) has 20 mg. Pork sausage -- 1 link (1 oz or 28 g) has 17 mg. Salmon -- 1 oz (28 g) has 16 mg. Tilapia -- 1 oz (28 g) has 14 mg. Dairy Soft-serve ice cream --  cup (4 oz or 86 g) has 103 mg. Whole-milk yogurt -- 1 cup (8 oz or 245 g) has 29 mg. Cheddar cheese -- 1 oz (28 g) has 28 mg. American cheese -- 1 oz (28 g) has 28 mg. Whole milk -- 1 cup (8 oz or 250 mL) has 23 mg. 2% milk -- 1 cup (8 oz or 250 mL) has 18 mg. Cream cheese -- 1 tablespoon (Tbsp) (14.5 g) has 15 mg. Cottage cheese --   cup (4 oz or 113 g) has  14 mg. Low-fat (1%) milk -- 1 cup (8 oz or 250 mL) has 10 mg. Sour cream -- 1 Tbsp (12 g) has 8.5 mg. Low-fat yogurt -- 1 cup (8 oz or 245 g) has 8 mg. Nonfat Greek yogurt -- 1 cup (8 oz or 228 g) has 7 mg. Half-and-half cream -- 1 Tbsp (15 mL) has 5 mg. Fats and oils Cod liver oil -- 1 tablespoon (Tbsp) (13.6 g) has 82 mg. Butter -- 1 Tbsp (14 g) has 15 mg. Lard -- 1 Tbsp (12.8 g) has 14 mg. Bacon grease -- 1 Tbsp (12.9 g) has 14 mg. Mayonnaise -- 1 Tbsp (13.8 g) has 5-10 mg. Margarine -- 1 Tbsp (14 g) has 3-10 mg. The items listed above may not be a complete list of foods with cholesterol. Exact amounts of cholesterol in these foods may vary depending on specific ingredients and brands. Contact a dietitian for more information. What foods do not have cholesterol? Most plant-based foods do not have cholesterol unless you combine them with a food that has cholesterol. Foods without cholesterol include: Grains and cereals. Vegetables. Fruits. Vegetable oils, such as olive, canola, and sunflower oil. Legumes, such as peas, beans, and lentils. Nuts and seeds. Egg whites. The items listed above may not be a complete list of foods that do not have cholesterol. Contact a dietitian for more information. Summary The body needs cholesterol in small amounts, but too much cholesterol can cause damage to the arteries and heart. Cholesterol is found in animal-based foods, such as meat, seafood, and dairy. Generally, low-fat dairy and lean meats have less cholesterol than full-fat dairy and fatty meats. This information is not intended to replace advice given to you by your health care provider. Make sure you discuss any questions you have with your health care provider. Document Revised: 05/04/2020 Document Reviewed: 05/04/2020 Elsevier Patient Education  [place].

## 2021-10-23 ENCOUNTER — Other Ambulatory Visit: Payer: Self-pay | Admitting: Internal Medicine

## 2021-10-23 DIAGNOSIS — U071 COVID-19: Secondary | ICD-10-CM

## 2021-12-23 DIAGNOSIS — H47011 Ischemic optic neuropathy, right eye: Secondary | ICD-10-CM | POA: Diagnosis not present

## 2021-12-23 DIAGNOSIS — H35373 Puckering of macula, bilateral: Secondary | ICD-10-CM | POA: Diagnosis not present

## 2022-02-10 DIAGNOSIS — M2021 Hallux rigidus, right foot: Secondary | ICD-10-CM | POA: Diagnosis not present

## 2022-02-10 DIAGNOSIS — M722 Plantar fascial fibromatosis: Secondary | ICD-10-CM | POA: Diagnosis not present

## 2022-02-10 DIAGNOSIS — M779 Enthesopathy, unspecified: Secondary | ICD-10-CM | POA: Diagnosis not present

## 2022-02-10 DIAGNOSIS — M654 Radial styloid tenosynovitis [de Quervain]: Secondary | ICD-10-CM | POA: Diagnosis not present

## 2022-02-10 DIAGNOSIS — M159 Polyosteoarthritis, unspecified: Secondary | ICD-10-CM | POA: Diagnosis not present

## 2022-02-10 DIAGNOSIS — M2022 Hallux rigidus, left foot: Secondary | ICD-10-CM | POA: Diagnosis not present

## 2022-02-10 DIAGNOSIS — M659 Synovitis and tenosynovitis, unspecified: Secondary | ICD-10-CM | POA: Diagnosis not present

## 2022-02-11 DIAGNOSIS — D2272 Melanocytic nevi of left lower limb, including hip: Secondary | ICD-10-CM | POA: Diagnosis not present

## 2022-02-11 DIAGNOSIS — Z85828 Personal history of other malignant neoplasm of skin: Secondary | ICD-10-CM | POA: Diagnosis not present

## 2022-02-11 DIAGNOSIS — L821 Other seborrheic keratosis: Secondary | ICD-10-CM | POA: Diagnosis not present

## 2022-02-11 DIAGNOSIS — D2262 Melanocytic nevi of left upper limb, including shoulder: Secondary | ICD-10-CM | POA: Diagnosis not present

## 2022-02-11 DIAGNOSIS — D225 Melanocytic nevi of trunk: Secondary | ICD-10-CM | POA: Diagnosis not present

## 2022-04-08 ENCOUNTER — Ambulatory Visit: Payer: MEDICARE | Admitting: Nurse Practitioner

## 2022-04-09 ENCOUNTER — Ambulatory Visit (INDEPENDENT_AMBULATORY_CARE_PROVIDER_SITE_OTHER): Payer: MEDICARE | Admitting: Family Medicine

## 2022-04-09 ENCOUNTER — Encounter: Payer: Self-pay | Admitting: Family Medicine

## 2022-04-09 ENCOUNTER — Ambulatory Visit: Payer: MEDICARE | Admitting: Family

## 2022-04-09 VITALS — BP 121/78 | HR 71 | Temp 98.1°F | Ht 61.5 in | Wt 131.6 lb

## 2022-04-09 DIAGNOSIS — I10 Essential (primary) hypertension: Secondary | ICD-10-CM | POA: Diagnosis not present

## 2022-04-09 DIAGNOSIS — Z1231 Encounter for screening mammogram for malignant neoplasm of breast: Secondary | ICD-10-CM

## 2022-04-09 DIAGNOSIS — R42 Dizziness and giddiness: Secondary | ICD-10-CM

## 2022-04-09 DIAGNOSIS — R059 Cough, unspecified: Secondary | ICD-10-CM | POA: Insufficient documentation

## 2022-04-09 MED ORDER — ONDANSETRON HCL 4 MG PO TABS: 4.0000 mg | ORAL_TABLET | Freq: Three times a day (TID) | ORAL | 0 refills | Status: DC | PRN

## 2022-04-09 MED ORDER — PREDNISONE 20 MG PO TABS: ORAL_TABLET | ORAL | 0 refills | Status: AC

## 2022-04-09 MED ORDER — MECLIZINE HCL 12.5 MG PO TABS: 12.5000 mg | ORAL_TABLET | Freq: Three times a day (TID) | ORAL | 0 refills | Status: DC | PRN

## 2022-04-09 NOTE — Patient Instructions (Addendum)
It was a pleasure meeting you today. Thank you for allowing me to take part in your health care.  Our goals for today as we discussed include:  Start Meclizine 12.5 mg at night.  Can increase to 3 times a day if needed.  Use when dizziness begins. Start Prednisone daily for 7 days Start Zofran as needed for nausea  Please schedule lab appointment for labs  Increase water intake Limit salt intake  If symptoms worsen follow up with PCP  Please arrive 15 minutes prior to your appointment.  Arrivals 5 minutes past your appointment time will need to be rescheduled.  This is to ensure that all patients are seen in a timely manner.  Thank you for your understanding and cooperation.    If you have any questions or concerns, please do not hesitate to call the office at [phone].  I look forward to our next visit and until then take care and stay safe.  Regards,   Carollee Leitz, MD   [facility]

## 2022-04-09 NOTE — Progress Notes (Signed)
SUBJECTIVE:   Chief Complaint  Patient presents with   Acute Visit    Migraine/ fatigue/ dizziness X march 3    HPI Patient presents to clinic with concern for headache and dizziness.  Has had symptoms similar in the past.  Reports vertigo ongoing x 1 month.  Initially started after episode of fatigue and diarrhea.  Seem to improve but then worsened.  Has had some photophobia and nausea with headache.  Tylenol has relieved headache symptoms.  Denies any fevers, slurred speech, weakness, numbness or tingling of extremities, facial drooping.   Reports sensorineural hearing loss of the left and right ear.  Has cross hearing aid and thinks connection is disrupted.  Reports tried Epley's maneuver but did not help with dizziness and made it worse.     PERTINENT PMH / PSH: History of migraines History of vertigo History of sensorineural hearing loss left ear   OBJECTIVE:  BP 121/78   Pulse 71   Temp 98.1 F (36.7 C) (Oral)   Ht 5' 1.5" (1.562 m)   Wt 131 lb 9.6 oz (59.7 kg)   SpO2 97%   BMI 24.46 kg/m    Physical Exam Vitals reviewed.  Constitutional:      General: She is not in acute distress.    Appearance: She is not ill-appearing.  HENT:     Head: Normocephalic.     Right Ear: Tympanic membrane normal.     Left Ear: Tympanic membrane normal.     Nose: Nose normal.  Eyes:     General: No visual field deficit.    Conjunctiva/sclera: Conjunctivae normal.  Cardiovascular:     Rate and Rhythm: Normal rate and regular rhythm.     Heart sounds: Normal heart sounds.  Pulmonary:     Effort: Pulmonary effort is normal.     Breath sounds: Normal breath sounds.  Musculoskeletal:        General: Normal range of motion.     Cervical back: Normal range of motion.  Neurological:     General: No focal deficit present.     Mental Status: She is alert and oriented to person, place, and time. Mental status is at baseline.     Cranial Nerves: No dysarthria or facial asymmetry.      Sensory: Sensation is intact. No sensory deficit.     Motor: No weakness, tremor, atrophy, abnormal muscle tone or seizure activity.     Coordination: Coordination normal. Finger-Nose-Finger Test and Heel to Shin Test normal.     Gait: Gait and tandem walk normal.  Psychiatric:        Mood and Affect: Mood normal.        Behavior: Behavior normal.        Thought Content: Thought content normal.        Judgment: Judgment normal.     ASSESSMENT/PLAN:  Vertigo Assessment & Plan: Chronic.  Low suspicion for CVA, TIA given no focal deficits on neurologic exam.  Has had similar symptoms in the past.  Differentials include vestibular migraine, labyrinthitis, BPPV. Start meclizine 12.5 mg nightly Start prednisone 50 mg daily x 3 days then taper by 10 mg for total of 7-day course. Check CBC and CMET today. Follow-up with ENT Strict return precautions provided.  Orders: -     Meclizine HCl; Take 1 tablet (12.5 mg total) by mouth 3 (three) times daily as needed for dizziness.  Dispense: 30 tablet; Refill: 0 -     Comprehensive metabolic panel; Future -  TSH; Future -     CBC with Differential/Platelet; Future -     predniSONE; Take 2.5 tablets (50 mg total) by mouth daily with breakfast for 3 days, THEN 2 tablets (40 mg total) daily with breakfast for 1 day, THEN 1.5 tablets (30 mg total) daily with breakfast for 1 day, THEN 1 tablet (20 mg total) daily with breakfast for 1 day, THEN 0.5 tablets (10 mg total) daily with breakfast for 1 day.  Dispense: 12.5 tablet; Refill: 0 -     Ondansetron HCl; Take 1 tablet (4 mg total) by mouth every 8 (eight) hours as needed for nausea or vomiting.  Dispense: 20 tablet; Refill: 0  Screening mammogram, encounter for -     3D Screening Mammogram, Left and Right; Future  Primary hypertension Assessment & Plan: Chronic.  Not currently on blood pressure medication. Blood pressure initially elevated, repeat is normal.  Given headaches and concern for  dizziness, orthostatic vital signs checked.  CMA reports that upon entering room patient was doing squats prior to orthostatic vitals.  This ultimately skewed results and blood pressure was elevated x 3.  Was allowed to rest for 10 minutes and repeated orthostatic vitals.  Again remained elevated. Return in a.m. for labs and orthostatic vital signs. Strict return precautions provided.     PDMP reviewed  Return in about 1 day (around 04/10/2022) for LAB, RN clinic, orthostatic blood pressure.  Dana Allananya Garey Alleva, MD

## 2022-04-10 ENCOUNTER — Ambulatory Visit: Payer: MEDICARE | Admitting: Family Medicine

## 2022-04-10 ENCOUNTER — Encounter: Payer: Self-pay | Admitting: Family Medicine

## 2022-04-10 ENCOUNTER — Other Ambulatory Visit: Payer: Self-pay | Admitting: Family Medicine

## 2022-04-10 ENCOUNTER — Ambulatory Visit (INDEPENDENT_AMBULATORY_CARE_PROVIDER_SITE_OTHER): Payer: MEDICARE

## 2022-04-10 VITALS — Ht 61.5 in | Wt 131.0 lb

## 2022-04-10 DIAGNOSIS — R42 Dizziness and giddiness: Secondary | ICD-10-CM

## 2022-04-10 DIAGNOSIS — I10 Essential (primary) hypertension: Secondary | ICD-10-CM

## 2022-04-10 LAB — CBC WITH DIFFERENTIAL/PLATELET
Basophils Absolute: 0 10*3/uL (ref 0.0–0.1)
Basophils Relative: 0.7 % (ref 0.0–3.0)
Eosinophils Absolute: 0 10*3/uL (ref 0.0–0.7)
Eosinophils Relative: 0.7 % (ref 0.0–5.0)
HCT: 40.9 % (ref 36.0–46.0)
Hemoglobin: 14 g/dL (ref 12.0–15.0)
Lymphocytes Relative: 13.1 % (ref 12.0–46.0)
Lymphs Abs: 0.9 10*3/uL (ref 0.7–4.0)
MCHC: 34.2 g/dL (ref 30.0–36.0)
MCV: 92.3 fl (ref 78.0–100.0)
Monocytes Absolute: 0.1 10*3/uL (ref 0.1–1.0)
Monocytes Relative: 1.7 % — ABNORMAL LOW (ref 3.0–12.0)
Neutro Abs: 5.5 10*3/uL (ref 1.4–7.7)
Neutrophils Relative %: 83.8 % — ABNORMAL HIGH (ref 43.0–77.0)
Platelets: 274 10*3/uL (ref 150.0–400.0)
RBC: 4.43 Mil/uL (ref 3.87–5.11)
RDW: 13.6 % (ref 11.5–15.5)
WBC: 6.5 10*3/uL (ref 4.0–10.5)

## 2022-04-10 LAB — COMPREHENSIVE METABOLIC PANEL
ALT: 13 U/L (ref 0–35)
AST: 19 U/L (ref 0–37)
Albumin: 4.3 g/dL (ref 3.5–5.2)
Alkaline Phosphatase: 61 U/L (ref 39–117)
BUN: 15 mg/dL (ref 6–23)
CO2: 29 mEq/L (ref 19–32)
Calcium: 9.5 mg/dL (ref 8.4–10.5)
Chloride: 105 mEq/L (ref 96–112)
Creatinine, Ser: 0.67 mg/dL (ref 0.40–1.20)
GFR: 88.06 mL/min (ref >60.00)
Glucose, Bld: 109 mg/dL — ABNORMAL HIGH (ref 70–99)
Potassium: 3.8 mEq/L (ref 3.5–5.1)
Sodium: 143 mEq/L (ref 135–145)
Total Bilirubin: 0.6 mg/dL (ref 0.2–1.2)
Total Protein: 6.5 g/dL (ref 6.0–8.3)

## 2022-04-10 LAB — TSH: TSH: 1.84 u[IU]/mL (ref 0.35–5.50)

## 2022-04-10 MED ORDER — AMLODIPINE BESYLATE 2.5 MG PO TABS: 2.5000 mg | ORAL_TABLET | Freq: Every day | ORAL | 0 refills | Status: DC

## 2022-04-10 NOTE — Progress Notes (Signed)
   SUBJECTIVE:   Patient seen in RN clinic for follow up orthostatic vitals.     Orthostatic VS for the past 72 hrs (Last 3 readings):  Orthostatic BP Patient Position BP Location Cuff Size  04/10/22 1723 155/78 Standing Left Arm Normal  04/10/22 1722 162/77 Sitting Left Arm Normal  04/10/22 1718 167/76 Supine Left Arm Normal     Report from RN patient continues to have vertigo.  Symptoms had not worsened. Plan to start Amlodipine Requested ECG prior to initiation of AVN I have reviewed the ECG personally. EKG: Sinus Rhythm, no ST elevation/depression. HR 66  Hypertension New problem Start Amlodipine 2.5 mg at night    Spoke with patient at 1800 Reports slept most of day.  Continues to have dizziness but no worse and somewhat improved now.   Had taken Meclizine last night and prior to visit.  Did not help just made her feel sleepy.  Has appointment with ENT scheduled.  Follow up in 2 weeks Strict return precautions provided.  Carollee Leitz, MD

## 2022-04-10 NOTE — Patient Instructions (Signed)
It was a pleasure meeting you today. Thank you for allowing me to take part in your health care.  Our goals for today as we discussed include:  Start Amlodipine 2.5 mg at night Limit salt intake  Continue Prednisone as previously prescribed Follow up with ENT as scheduled  Follow up with PCP in 2 weeks  Please arrive 15 minutes prior to your appointment.  Arrivals 5 minutes past your appointment time will need to be rescheduled.  This is to ensure that all patients are seen in a timely manner.  Thank you for your understanding and cooperation.    If you have any questions or concerns, please do not hesitate to call the office at [phone].  I look forward to our next visit and until then take care and stay safe.  Regards,   Carollee Leitz, MD   [facility]

## 2022-04-13 ENCOUNTER — Encounter: Payer: Self-pay | Admitting: Family Medicine

## 2022-04-13 DIAGNOSIS — Z1231 Encounter for screening mammogram for malignant neoplasm of breast: Secondary | ICD-10-CM | POA: Insufficient documentation

## 2022-04-13 NOTE — Assessment & Plan Note (Addendum)
Chronic.  Not currently on blood pressure medication. Blood pressure initially elevated, repeat is normal.  Given headaches and concern for dizziness, orthostatic vital signs checked.  CMA reports that upon entering room patient was doing squats prior to orthostatic vitals.  This ultimately skewed results and blood pressure was elevated x 3.  Was allowed to rest for 10 minutes and repeated orthostatic vitals.  Again remained elevated. Return in a.m. for labs and orthostatic vital signs. Strict return precautions provided.

## 2022-04-13 NOTE — Assessment & Plan Note (Addendum)
Chronic.  Low suspicion for CVA, TIA given no focal deficits on neurologic exam.  Has had similar symptoms in the past.  Differentials include vestibular migraine, labyrinthitis, BPPV. Start meclizine 12.5 mg nightly Start prednisone 50 mg daily x 3 days then taper by 10 mg for total of 7-day course. Check CBC and CMET today. Follow-up with ENT Strict return precautions provided.

## 2022-04-14 DIAGNOSIS — H8111 Benign paroxysmal vertigo, right ear: Secondary | ICD-10-CM | POA: Diagnosis not present

## 2022-04-30 DIAGNOSIS — H8111 Benign paroxysmal vertigo, right ear: Secondary | ICD-10-CM | POA: Diagnosis not present

## 2022-05-02 ENCOUNTER — Ambulatory Visit (INDEPENDENT_AMBULATORY_CARE_PROVIDER_SITE_OTHER): Payer: MEDICARE | Admitting: Family Medicine

## 2022-05-02 VITALS — BP 130/80 | HR 78 | Wt 133.8 lb

## 2022-05-02 DIAGNOSIS — I10 Essential (primary) hypertension: Secondary | ICD-10-CM | POA: Diagnosis not present

## 2022-05-02 DIAGNOSIS — H8110 Benign paroxysmal vertigo, unspecified ear: Secondary | ICD-10-CM

## 2022-05-02 NOTE — Progress Notes (Unsigned)
   SUBJECTIVE:   Chief Complaint  Patient presents with   Hypertension   HPI Presents to clinic for follow-up hypertension.  Patient last seen 04/03 for dizziness.  Blood pressure initially elevated, repeat lowered however upon checking orthostatics blood pressure elevated to 150s-160s.  Patient returned following day for repeat blood pressure check and again remain elevated 155-167/76-78.  Amlodipine 2.5 mg was initiated at that time.  Since then patient reports tolerating medication well.  Blood pressure at home 145/89.  She reports have been taking her blood pressure after activity. Has been using wrist cuff.  Dizziness Patient reports having seen ENT who confirmed BPPV.  She now is also receiving physical therapy and reports symptoms improving.   PERTINENT PMH / PSH: History of migraines History of vertigo History of sensorineural hearing loss left ear   OBJECTIVE:  BP 130/80   Pulse 78   Wt 133 lb 12.8 oz (60.7 kg)   SpO2 96%   BMI 24.87 kg/m    Physical Exam Vitals reviewed.  Constitutional:      General: She is not in acute distress.    Appearance: Normal appearance. She is normal weight. She is not ill-appearing, toxic-appearing or diaphoretic.  Eyes:     General:        Right eye: No discharge.        Left eye: No discharge.     Conjunctiva/sclera: Conjunctivae normal.  Cardiovascular:     Rate and Rhythm: Normal rate and regular rhythm.     Heart sounds: Normal heart sounds.  Pulmonary:     Effort: Pulmonary effort is normal.     Breath sounds: Normal breath sounds.  Musculoskeletal:        General: Normal range of motion.  Skin:    General: Skin is warm and dry.  Neurological:     General: No focal deficit present.     Mental Status: She is alert and oriented to person, place, and time. Mental status is at baseline.  Psychiatric:        Mood and Affect: Mood normal.        Behavior: Behavior normal.        Thought Content: Thought content normal.         Judgment: Judgment normal.     ASSESSMENT/PLAN:  Primary hypertension Assessment & Plan: Chronic.  Stable.  Asymptomatic.  Tolerating medication well. Will continue amlodipine 2.5 mg daily given blood pressure checked after activity was elevated.  Patient will monitor blood pressure at home and resting quite comfortable state.  If blood pressure greater than 140/90 will notify MD. Follow-up in 2 months Strict return precautions provided.      Benign paroxysmal positional vertigo, unspecified laterality Assessment & Plan: Improving with vestibular rehab. Follows with ENT.   HCM Recommend tetanus booster Recommend shingles vaccine   PDMP reviewed  Return in about 2 months (around 07/02/2022) for PCP.  Dana Allan, MD

## 2022-05-02 NOTE — Patient Instructions (Addendum)
It was a pleasure meeting you today. Thank you for allowing me to take part in your health care.  Our goals for today as we discussed include:  Your blood pressure is good today. Continue amlodipine 2.5 mg at night Recommend blood pressure machine that uses a cuff on upper arm. Continue to monitor blood pressure.  Goal less than 140/90  Recommend Shingles vaccine.  This is a 2 dose series and can be given at your local pharmacy.  Please talk to your pharmacist about this.   Recommend Tetanus Vaccination.  This is given every 10 years.   Referral sent for Mammogram. Please call to schedule appointment. [facility] [address] [place], [place] [address] [phone]    Follow-up in 2 months  If you have any questions or concerns, please do not hesitate to call the office at [phone].  I look forward to our next visit and until then take care and stay safe.  Regards,   Dana Allan, MD   [facility]

## 2022-05-06 ENCOUNTER — Ambulatory Visit
Admission: RE | Admit: 2022-05-06 | Discharge: 2022-05-06 | Disposition: A | Discharge status: Home / Self Care | Payer: MEDICARE | Source: Ambulatory Visit | Attending: Family Medicine | Admitting: Family Medicine

## 2022-05-06 DIAGNOSIS — Z1231 Encounter for screening mammogram for malignant neoplasm of breast: Secondary | ICD-10-CM | POA: Diagnosis not present

## 2022-05-07 DIAGNOSIS — H8112 Benign paroxysmal vertigo, left ear: Secondary | ICD-10-CM | POA: Diagnosis not present

## 2022-05-14 ENCOUNTER — Encounter: Payer: Self-pay | Admitting: Family Medicine

## 2022-05-14 DIAGNOSIS — H8110 Benign paroxysmal vertigo, unspecified ear: Secondary | ICD-10-CM | POA: Insufficient documentation

## 2022-05-14 NOTE — Assessment & Plan Note (Signed)
Improving with vestibular rehab. Follows with ENT.

## 2022-05-14 NOTE — Assessment & Plan Note (Signed)
Chronic.  Stable.  Asymptomatic.  Tolerating medication well. Will continue amlodipine 2.5 mg daily given blood pressure checked after activity was elevated.  Patient will monitor blood pressure at home and resting quite comfortable state.  If blood pressure greater than 140/90 will notify MD. Follow-up in 2 months Strict return precautions provided.

## 2022-05-19 DIAGNOSIS — H8112 Benign paroxysmal vertigo, left ear: Secondary | ICD-10-CM | POA: Diagnosis not present

## 2022-06-13 DIAGNOSIS — H47011 Ischemic optic neuropathy, right eye: Secondary | ICD-10-CM | POA: Diagnosis not present

## 2022-06-13 DIAGNOSIS — H40003 Preglaucoma, unspecified, bilateral: Secondary | ICD-10-CM | POA: Diagnosis not present

## 2022-07-01 NOTE — Progress Notes (Unsigned)
   SUBJECTIVE:   No chief complaint on file.  HPI Presents to clinic for follow-up hypertension.  Patient last seen 04/03 for dizziness.  Blood pressure initially elevated, repeat lowered however upon checking orthostatics blood pressure elevated to 150s-160s.  Patient returned following day for repeat blood pressure check and again remain elevated 155-167/76-78.  Amlodipine 2.5 mg was initiated at that time.  Since then patient reports tolerating medication well.  Blood pressure at home 145/89.  She reports have been taking her blood pressure after activity. Has been using wrist cuff.  Dizziness Patient reports having seen ENT who confirmed BPPV.  She now is also receiving physical therapy and reports symptoms improving.   PERTINENT PMH / PSH: History of migraines History of vertigo History of sensorineural hearing loss left ear   OBJECTIVE:  There were no vitals taken for this visit.   Physical Exam Vitals reviewed.  Constitutional:      General: She is not in acute distress.    Appearance: Normal appearance. She is normal weight. She is not ill-appearing, toxic-appearing or diaphoretic.  Eyes:     General:        Right eye: No discharge.        Left eye: No discharge.     Conjunctiva/sclera: Conjunctivae normal.  Cardiovascular:     Rate and Rhythm: Normal rate and regular rhythm.     Heart sounds: Normal heart sounds.  Pulmonary:     Effort: Pulmonary effort is normal.     Breath sounds: Normal breath sounds.  Musculoskeletal:        General: Normal range of motion.  Skin:    General: Skin is warm and dry.  Neurological:     General: No focal deficit present.     Mental Status: She is alert and oriented to person, place, and time. Mental status is at baseline.  Psychiatric:        Mood and Affect: Mood normal.        Behavior: Behavior normal.        Thought Content: Thought content normal.        Judgment: Judgment normal.    ASSESSMENT/PLAN:  There are no  diagnoses linked to this encounter. HCM Recommend tetanus booster Recommend shingles vaccine   PDMP reviewed  No follow-ups on file.  Dana Allan, MD

## 2022-07-01 NOTE — Patient Instructions (Incomplete)
It was a pleasure meeting you today. Thank you for allowing me to take part in your health care.  Our goals for today as we discussed include:  Will order ultrasound to check for stones Take ibuprofen 600 mg every 8 hours for pain as needed  Will get some labs today and notify your results.  4 weeks If you have any questions or concerns, please do not hesitate to call the office at [phone].  I look forward to our next visit and until then take care and stay safe.  Regards,   Kyeshia Zinn, MD   [place]  

## 2022-07-02 ENCOUNTER — Ambulatory Visit: Payer: MEDICARE | Admitting: Family Medicine

## 2022-07-02 ENCOUNTER — Ambulatory Visit (INDEPENDENT_AMBULATORY_CARE_PROVIDER_SITE_OTHER): Payer: MEDICARE | Admitting: Family Medicine

## 2022-07-02 ENCOUNTER — Encounter: Payer: Self-pay | Admitting: Family Medicine

## 2022-07-02 VITALS — BP 124/76 | HR 76 | Temp 97.0°F | Ht 61.5 in | Wt 135.6 lb

## 2022-07-02 DIAGNOSIS — E559 Vitamin D deficiency, unspecified: Secondary | ICD-10-CM | POA: Diagnosis not present

## 2022-07-02 DIAGNOSIS — M810 Age-related osteoporosis without current pathological fracture: Secondary | ICD-10-CM | POA: Diagnosis not present

## 2022-07-02 DIAGNOSIS — Z780 Asymptomatic menopausal state: Secondary | ICD-10-CM

## 2022-07-02 DIAGNOSIS — E785 Hyperlipidemia, unspecified: Secondary | ICD-10-CM | POA: Diagnosis not present

## 2022-07-02 DIAGNOSIS — H8110 Benign paroxysmal vertigo, unspecified ear: Secondary | ICD-10-CM | POA: Diagnosis not present

## 2022-07-02 DIAGNOSIS — I10 Essential (primary) hypertension: Secondary | ICD-10-CM

## 2022-07-02 DIAGNOSIS — Z1231 Encounter for screening mammogram for malignant neoplasm of breast: Secondary | ICD-10-CM

## 2022-07-02 NOTE — Assessment & Plan Note (Signed)
Chronic.  Stable.  Asymptomatic.  Self discontinued CCB.  BP at goal <150/90 at home Discontinue Amlodipine.  Discussed with patient may need to restart if BP increased. Continue to monitor at home. Recent Cmet normal Follow up as needed

## 2022-07-02 NOTE — Assessment & Plan Note (Signed)
T score -2.5 on recent Dexa 2021 Tried Boniva and D/C secondary to side effects Not currently interested in restarting treatment Continue Calcium and Vitamin D supplements daily Resistance training for improved bone health Repeat Dexa in 1 year

## 2022-07-02 NOTE — Assessment & Plan Note (Signed)
Improved with vestibular rehab. Follows with ENT.

## 2022-08-05 ENCOUNTER — Telehealth: Payer: Self-pay | Admitting: Family Medicine

## 2022-08-05 NOTE — Telephone Encounter (Signed)
Patient dropped off document  Permission for [employer] to participate in research study , to be filled out by provider. Patient requested to send it via Call Patient to pick up within 5-days. Document is located in providers folder at front office.Please advise at Mobile [phone] (mobile).

## 2022-08-07 ENCOUNTER — Ambulatory Visit
Admission: EM | Admit: 2022-08-07 | Discharge: 2022-08-07 | Disposition: A | Discharge status: Home / Self Care | Payer: Medicare Other | Attending: Emergency Medicine | Admitting: Emergency Medicine

## 2022-08-07 ENCOUNTER — Ambulatory Visit: Payer: Self-pay

## 2022-08-07 DIAGNOSIS — B349 Viral infection, unspecified: Secondary | ICD-10-CM | POA: Insufficient documentation

## 2022-08-07 DIAGNOSIS — Z1152 Encounter for screening for COVID-19: Secondary | ICD-10-CM | POA: Diagnosis not present

## 2022-08-07 DIAGNOSIS — R531 Weakness: Secondary | ICD-10-CM | POA: Diagnosis not present

## 2022-08-07 DIAGNOSIS — Z8616 Personal history of COVID-19: Secondary | ICD-10-CM | POA: Diagnosis not present

## 2022-08-07 HISTORY — DX: Dizziness and giddiness: R42

## 2022-08-07 MED ORDER — ACETAMINOPHEN 325 MG PO TABS
975.0000 mg | ORAL_TABLET | Freq: Once | ORAL | Status: AC
  Administered 2022-08-07: 975 mg via ORAL

## 2022-08-07 MED ORDER — ACETAMINOPHEN 500 MG PO TABS
1000.0000 mg | ORAL_TABLET | Freq: Once | ORAL | Status: AC
  Administered 2022-08-07: 1000 mg via ORAL

## 2022-08-07 NOTE — Discharge Instructions (Addendum)
Drink plenty of fluids, may take Tylenol every 6 hours do not exceed 4000 mg in 24 hours.  Check MyChart for COVID results.  Go to ER if your headache worsens or you have new or worsening symptoms(unable to keep fluids down, loss of function,etc)

## 2022-08-07 NOTE — ED Provider Notes (Addendum)
Renaldo Fiddler    CSN: [id number] Arrival date & time: 08/07/22  4010      History   Chief Complaint Chief Complaint  Patient presents with   Weakness    HPI Esther Russon is a 72 y.o. female.   72 year old female pt, Rielly Noftz, presents to ER with chief complaint of weakness, body aches/chills headache"feels like a band around my head".  Pt received shingles and Tdap vaccine yesterday, last took tylenol 0900 today,none since.  No none illness exposure  The history is provided by the patient.    Past Medical History:  Diagnosis Date   Arthritis    hands   COVID-19    09/2021   Hearing loss    left ear 09/27/16 MR, CT negative etiology she had rash neck 2 days before hearing loss left ear so ? virus   History of chicken pox    Migraine    tries Excedrine migraine-normally sees swigly lines/distorted vision unable to talk, left arm recently numb; senstive light and sound   Osteoporosis    UTI (urinary tract infection)    Vertigo    Vitamin D deficiency     Patient Active Problem List   Diagnosis Date Noted   Nonspecific syndrome suggestive of viral illness 08/07/2022   Breast cancer screening by mammogram 07/02/2022   Postmenopausal estrogen deficiency 07/02/2022   BPPV (benign paroxysmal positional vertigo) 05/14/2022   Screening mammogram, encounter for 04/13/2022   Primary hypertension 04/10/2022   Cough 04/09/2022   Hip pain, chronic, right 06/14/2020   Cervicalgia 06/14/2020   Decreased peripheral vision 05/24/2019   Arthritis of right hand 05/24/2019   Bursitis of hip 05/24/2019   Vertigo 03/16/2019   B12 deficiency 11/23/2018   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   Osteoporosis 11/23/2018   Hyperlipidemia 11/23/2018   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      Home Medications    Prior to Admission medications   Medication Sig Start Date End Date Taking? Authorizing Provider  Cholecalciferol (VITAMIN D3) 50 MCG (2000 UT) TABS Take by mouth.    [provider]  Turmeric (QC TUMERIC COMPLEX PO) Take by mouth.    [provider]    Family History Family History  Problem Relation Age of Onset   Lung cancer Mother    Early death Mother    Cancer Mother        lung cancer smoker    Heart attack Father    Stroke Father    Heart disease Father    Arthritis Brother        rheumatoid   Migraines Brother    Migraines Daughter    Diabetes Maternal Grandmother        died comp. DM 2    Early death Maternal Grandmother  Hypertension Maternal Grandmother    Early death Paternal Grandfather    Hearing loss Paternal Grandfather     Social History Social History   Tobacco Use   Smoking status: Never   Smokeless tobacco: Never  Vaping Use   Vaping status: Never Used  Substance Use Topics   Alcohol use: Never   Drug use: Never     Allergies   Boniva [ibandronic acid]   Review of Systems Review of Systems  Constitutional:  Positive for chills, fatigue and fever.  Musculoskeletal:  Positive for myalgias.  Neurological:  Positive for headaches.  All other systems reviewed and are negative.    Physical Exam Triage Vital Signs ED Triage Vitals  Encounter Vitals Group     BP      Systolic BP Percentile      Diastolic BP Percentile      Pulse      Resp      Temp      Temp src      SpO2       Weight      Height      Head Circumference      Peak Flow      Pain Score      Pain Loc      Pain Education      Exclude from Growth Chart    No data found.  Updated Vital Signs BP (!) 167/79   Pulse 98   Temp (!) 100.6 F (38.1 C)   Resp 18   SpO2 94%   Visual Acuity Right Eye Distance:   Left Eye Distance:   Bilateral Distance:    Right Eye Near:   Left Eye Near:    Bilateral Near:     Physical Exam Vitals and nursing note reviewed.  Constitutional:      General: She is not in acute distress.    Appearance: She is well-developed.  HENT:     Head: Normocephalic.     Right Ear: Tympanic membrane is retracted.     Left Ear: Tympanic membrane is retracted.     Nose: Congestion present.     Mouth/Throat:     Lips: Pink.     Mouth: Mucous membranes are moist.     Pharynx: Oropharynx is clear.  Eyes:     General: Lids are normal.     Conjunctiva/sclera: Conjunctivae normal.     Pupils: Pupils are equal, round, and reactive to light.  Neck:     Trachea: No tracheal deviation.  Cardiovascular:     Rate and Rhythm: Normal rate and regular rhythm.     Pulses: Normal pulses.     Heart sounds: Normal heart sounds. No murmur heard. Pulmonary:     Effort: Pulmonary effort is normal.     Breath sounds: Normal breath sounds and air entry.  Abdominal:     General: Bowel sounds are normal.     Palpations: Abdomen is soft.     Tenderness: There is no abdominal tenderness.  Musculoskeletal:        General: Normal range of motion.     Cervical back: Normal range of motion.  Lymphadenopathy:     Cervical: No cervical adenopathy.  Skin:    General: Skin is warm and dry.     Findings: No rash.  Neurological:     General: No focal deficit present.     Mental Status: She is alert and oriented to person, place, and time.  GCS: GCS eye subscore is 4. GCS verbal subscore is 5. GCS motor subscore is 6.     Cranial Nerves: No cranial nerve deficit.     Sensory: No  sensory deficit.  Psychiatric:        Attention and Perception: Attention normal.        Mood and Affect: Mood normal.        Speech: Speech normal.        Behavior: Behavior normal. Behavior is cooperative.      UC Treatments / Results  Labs (all labs ordered are listed, but only abnormal results are displayed) Labs Reviewed  SARS CORONAVIRUS 2 (TAT 6-24 HRS)    EKG   Radiology No results found.  Procedures Procedures (including critical care time)  Medications Ordered in UC Medications  acetaminophen (TYLENOL) tablet 975 mg (975 mg Oral Given 08/07/22 1855)  acetaminophen (TYLENOL) tablet 1,000 mg (1,000 mg Oral Given 08/07/22 1858)    Initial Impression / Assessment and Plan / UC Course  I have reviewed the triage vital signs and the nursing notes.  Pertinent labs & imaging results that were available during my care of the patient were reviewed by me and considered in my medical decision making (see chart for details).  Clinical Course as of 08/07/22 1950  Thu Aug 07, 2022  1857 Tylenol 1 gm po for temp 100.6 [JD]    Clinical Course User Index [JD] Davia Smyre, Para March, NP   Discussed exam findings and plan of care with pt, rest,push fluids,take tylenol as discussed. Go to ER for new or worsening issues or concerns. Pt and spouse verbalized understanding to this provider, patient states improvement after Tylenol and p.o. fluids in office.  Patient will check her MyChart for results, if patient is positive for COVID she would like to have prescription for Paxlovid.  Patient states she has taken this in the past and did well, discussed pros and cons and patient would like to go forward with prescription if needed.  Ddx: Viral illness, immunization reaction,allergies Final Clinical Impressions(s) / UC Diagnoses   Final diagnoses:  Nonspecific syndrome suggestive of viral illness     Discharge Instructions      Drink plenty of fluids, may take Tylenol every 6 hours  do not exceed 4000 mg in 24 hours.  Check MyChart for COVID results.  Go to ER if your headache worsens or you have new or worsening symptoms(unable to keep fluids down, loss of function,etc)     ED Prescriptions   None    PDMP not reviewed this encounter.   Clancy Gourd, NP 08/07/22 1943    Clancy Gourd, NP 08/07/22 1950

## 2022-08-07 NOTE — ED Triage Notes (Addendum)
Patient to Urgent Care with complaints of weakness/ generalized body aches/ chills/ headache.  Symptoms started today. No sick contacts. Take tylenol. Reports hx of vertigo- reports she did start to have some dizziness when she moves her head to the left side.  Reports receiving shingles/ TDAP vaccines at 3pm yesterday.

## 2022-08-07 NOTE — Telephone Encounter (Signed)
Placed in Dr. Clent Ridges box by her desk.

## 2022-08-12 NOTE — Telephone Encounter (Signed)
Pt called in to check status of previous form that was dropped off.

## 2022-08-13 ENCOUNTER — Other Ambulatory Visit: Payer: Self-pay | Admitting: Family Medicine

## 2022-08-13 DIAGNOSIS — I10 Essential (primary) hypertension: Secondary | ICD-10-CM

## 2022-08-13 MED ORDER — AMLODIPINE BESYLATE 2.5 MG PO TABS: 2.5000 mg | ORAL_TABLET | Freq: Every day | ORAL | 1 refills | Status: DC

## 2022-08-13 NOTE — Telephone Encounter (Signed)
Called pt and informed her of this information.

## 2022-09-09 DIAGNOSIS — S20469A Insect bite (nonvenomous) of unspecified back wall of thorax, initial encounter: Secondary | ICD-10-CM | POA: Diagnosis not present

## 2022-09-09 DIAGNOSIS — S50361A Insect bite (nonvenomous) of right elbow, initial encounter: Secondary | ICD-10-CM | POA: Diagnosis not present

## 2022-09-09 DIAGNOSIS — S40862A Insect bite (nonvenomous) of left upper arm, initial encounter: Secondary | ICD-10-CM | POA: Diagnosis not present

## 2022-09-09 DIAGNOSIS — S20461A Insect bite (nonvenomous) of right back wall of thorax, initial encounter: Secondary | ICD-10-CM | POA: Diagnosis not present

## 2022-09-09 DIAGNOSIS — S70361A Insect bite (nonvenomous), right thigh, initial encounter: Secondary | ICD-10-CM | POA: Diagnosis not present

## 2022-09-09 DIAGNOSIS — S70362A Insect bite (nonvenomous), left thigh, initial encounter: Secondary | ICD-10-CM | POA: Diagnosis not present

## 2022-09-12 DIAGNOSIS — R42 Dizziness and giddiness: Secondary | ICD-10-CM | POA: Diagnosis not present

## 2022-09-12 DIAGNOSIS — H8112 Benign paroxysmal vertigo, left ear: Secondary | ICD-10-CM | POA: Diagnosis not present

## 2022-09-18 DIAGNOSIS — H8112 Benign paroxysmal vertigo, left ear: Secondary | ICD-10-CM | POA: Diagnosis not present

## 2022-09-18 DIAGNOSIS — R2681 Unsteadiness on feet: Secondary | ICD-10-CM | POA: Diagnosis not present

## 2022-09-22 DIAGNOSIS — Z23 Encounter for immunization: Secondary | ICD-10-CM | POA: Diagnosis not present

## 2022-09-23 DIAGNOSIS — H8112 Benign paroxysmal vertigo, left ear: Secondary | ICD-10-CM | POA: Diagnosis not present

## 2022-09-23 DIAGNOSIS — R2681 Unsteadiness on feet: Secondary | ICD-10-CM | POA: Diagnosis not present

## 2022-09-26 DIAGNOSIS — R2681 Unsteadiness on feet: Secondary | ICD-10-CM | POA: Diagnosis not present

## 2022-09-26 DIAGNOSIS — H8112 Benign paroxysmal vertigo, left ear: Secondary | ICD-10-CM | POA: Diagnosis not present

## 2022-09-29 DIAGNOSIS — R2681 Unsteadiness on feet: Secondary | ICD-10-CM | POA: Diagnosis not present

## 2022-09-29 DIAGNOSIS — H8112 Benign paroxysmal vertigo, left ear: Secondary | ICD-10-CM | POA: Diagnosis not present

## 2022-10-06 DIAGNOSIS — R2681 Unsteadiness on feet: Secondary | ICD-10-CM | POA: Diagnosis not present

## 2022-10-06 DIAGNOSIS — H8112 Benign paroxysmal vertigo, left ear: Secondary | ICD-10-CM | POA: Diagnosis not present

## 2022-10-15 ENCOUNTER — Telehealth: Payer: Self-pay | Admitting: Family Medicine

## 2022-10-15 ENCOUNTER — Ambulatory Visit (INDEPENDENT_AMBULATORY_CARE_PROVIDER_SITE_OTHER): Payer: MEDICARE | Admitting: *Deleted

## 2022-10-15 VITALS — Ht 61.5 in | Wt 130.0 lb

## 2022-10-15 DIAGNOSIS — Z0000 Encounter for general adult medical examination without abnormal findings: Secondary | ICD-10-CM

## 2022-10-15 NOTE — Telephone Encounter (Signed)
Updated in her chart.  

## 2022-10-15 NOTE — Progress Notes (Signed)
Subjective:   Monica Salazar is a 72 y.o. female who presents for Medicare Annual (Subsequent) preventive examination.  Visit Complete: Virtual I connected with  Minerva Ends on 10/15/22 by a audio enabled telemedicine application and verified that I am speaking with the correct person using two identifiers.  Patient Location: Home  Provider Location: Office/Clinic  I discussed the limitations of evaluation and management by telemedicine. The patient expressed understanding and agreed to proceed.  Vital Signs: Because this visit was a virtual/telehealth visit, some criteria may be missing or patient reported. Any vitals not documented were not able to be obtained and vitals that have been documented are patient reported.   Cardiac Risk Factors include: advanced age (>86men, >61 women);dyslipidemia;hypertension     Objective:    Today's Vitals   10/15/22 1429  Weight: 130 lb (59 kg)  Height: 5' 1.5" (1.562 m)   Body mass index is 24.17 kg/m.     10/15/2022    2:44 PM 08/07/2022    6:47 PM 10/10/2021   10:30 AM 10/03/2019    9:56 AM 09/30/2018   10:07 AM  Advanced Directives  Does Patient Have a Medical Advance Directive? Yes No Yes Yes Yes  Type of Estate agent of [place];Living will  Healthcare Power of [place];Living will Healthcare Power of [place];Living will Healthcare Power of [place];Living will  Does patient want to make changes to medical advance directive?   No - Patient declined No - Patient declined No - Patient declined  Copy of Healthcare Power of Attorney in Chart? No - copy requested  No - copy requested No - copy requested No - copy requested    Current Medications (verified) Outpatient Encounter Medications as of 10/15/2022  Medication Sig   amLODipine (NORVASC) 2.5 MG tablet Take 1 tablet (2.5 mg total) by mouth daily.   Cholecalciferol (VITAMIN D3) 50 MCG (2000 UT) TABS Take by mouth.   Multiple Vitamins-Minerals (CENTRUM SILVER  ULTRA WOMENS) TABS Take by mouth daily.   Plant Sterols and Stanols (CHOLESTOFF PO) Take by mouth daily.   Turmeric (QC TUMERIC COMPLEX PO) Take by mouth.   No facility-administered encounter medications on file as of 10/15/2022.    Allergies (verified) Boniva [ibandronic acid]   History: Past Medical History:  Diagnosis Date   Arthritis    hands   COVID-19    09/2021   Hearing loss    left ear 09/27/16 MR, CT negative etiology she had rash neck 2 days before hearing loss left ear so ? virus   History of chicken pox    Migraine    tries Excedrine migraine-normally sees swigly lines/distorted vision unable to talk, left arm recently numb; senstive light and sound   Osteoporosis    UTI (urinary tract infection)    Vertigo    Vitamin D deficiency    Past Surgical History:  Procedure Laterality Date   BREAST BIOPSY Left    excision   CESAREAN SECTION     1986   hospitalization     2006 untx'ed UTI   TONSILLECTOMY  1966   Family History  Problem Relation Age of Onset   Lung cancer Mother    Early death Mother    Cancer Mother        lung cancer smoker    Heart attack Father    Stroke Father    Heart disease Father    Arthritis Brother        rheumatoid   Migraines Brother  Migraines Daughter    Diabetes Maternal Grandmother        died comp. DM 2    Early death Maternal Grandmother    Hypertension Maternal Grandmother    Early death Paternal Grandfather    Hearing loss Paternal Grandfather    Social History   Socioeconomic History   Marital status: Married    Spouse name: Not on file   Number of children: Not on file   Years of education: Not on file   Highest education level: Not on file  Occupational History   Not on file  Tobacco Use   Smoking status: Never   Smokeless tobacco: Never  Vaping Use   Vaping status: Never Used  Substance and Sexual Activity   Alcohol use: Never   Drug use: Never   Sexual activity: Yes  Other Topics Concern   Not  on file  Social History Narrative   Married Sissy Hoff    1 kid Fleet Contras Daughter   And 1 stepkid and 1 adopted kid    Retired Event organiser    Owns guns, wears seat belt, safe in relationship    Lives in [place], [place] and [place] in past    Social Determinants of Health   Financial Resource Strain: Low Risk  (10/15/2022)   Overall Financial Resource Strain (CARDIA)    Difficulty of Paying Living Expenses: Not hard at all  Food Insecurity: No Food Insecurity (10/15/2022)   Hunger Vital Sign    Worried About Running Out of Food in the Last Year: Never true    Ran Out of Food in the Last Year: Never true  Transportation Needs: No Transportation Needs (10/15/2022)   PRAPARE - Administrator, Civil Service (Medical): No    Lack of Transportation (Non-Medical): No  Physical Activity: Sufficiently Active (10/15/2022)   Exercise Vital Sign    Days of Exercise per Week: 6 days    Minutes of Exercise per Session: 30 min  Stress: No Stress Concern Present (10/15/2022)   [employer] of Occupational Health - Occupational Stress Questionnaire    Feeling of Stress : Not at all  Social Connections: Socially Integrated (10/15/2022)   Social Connection and Isolation Panel [NHANES]    Frequency of Communication with Friends and Family: More than three times a week    Frequency of Social Gatherings with Friends and Family: Twice a week    Attends Religious Services: More than 4 times per year    Active Member of [employer] or Organizations: Yes    Attends Engineer, structural: More than 4 times per year    Marital Status: Married    Tobacco Counseling Counseling given: Not Answered   Clinical Intake:  Pre-visit preparation completed: Yes  Pain : No/denies pain     BMI - recorded: 24.17 Nutritional Status: BMI of 19-24  Normal Nutritional Risks: None Diabetes: No  How often do you need to have someone help you when you read instructions, pamphlets, or other written  materials from your doctor or pharmacy?: 1 - Never  Interpreter Needed?: No  Information entered by :: R. Alger Kerstein LPN   Activities of Daily Living    10/15/2022    2:31 PM  In your present state of health, do you have any difficulty performing the following activities:  Hearing? 1  Comment wears aids  Vision? 0  Comment glasses  Difficulty concentrating or making decisions? 0  Walking or climbing stairs? 0  Dressing or bathing? 0  Doing errands, shopping? 0  Preparing Food and eating ? N  Using the Toilet? N  In the past six months, have you accidently leaked urine? N  Do you have problems with loss of bowel control? N  Managing your Medications? N  Managing your Finances? N  Housekeeping or managing your Housekeeping? N    Patient Care Team: Dana Allan, MD as PCP - General (Family Medicine)  Indicate any recent Medical Services you may have received from other than [facility] providers in the past year (date may be approximate).     Assessment:   This is a routine wellness examination for Xylina.  Hearing/Vision screen Hearing Screening - Comments:: Wears aids Vision Screening - Comments:: glasses   Goals Addressed             This Visit's Progress    Patient Stated       Wants to continue to walk and exercise       Depression Screen    10/15/2022    2:38 PM 07/02/2022    9:55 AM 05/02/2022    8:22 AM 04/09/2022    4:14 PM 10/17/2021    9:02 AM 10/10/2021   10:09 AM 10/01/2021    2:10 PM  PHQ 2/9 Scores  PHQ - 2 Score 0 0 0 0 0 0 0  PHQ- 9 Score 0 0 0        Fall Risk    10/15/2022    2:33 PM 07/02/2022    9:55 AM 05/02/2022    8:22 AM 04/09/2022    4:14 PM 10/17/2021    9:02 AM  Fall Risk   Falls in the past year? 0 0 0 0 0  Number falls in past yr: 0 0  0 0  Injury with Fall? 0 0  0 0  Risk for fall due to : No Fall Risks No Fall Risks  No Fall Risks No Fall Risks  Follow up Falls prevention discussed;Falls evaluation completed Falls evaluation  completed  Falls evaluation completed Falls evaluation completed    MEDICARE RISK AT HOME: Medicare Risk at Home Any stairs in or around the home?: Yes If so, are there any without handrails?: No Home free of loose throw rugs in walkways, pet beds, electrical cords, etc?: Yes Adequate lighting in your home to reduce risk of falls?: Yes Life alert?: No Use of a cane, walker or w/c?: No Grab bars in the bathroom?: Yes Shower chair or bench in shower?: Yes Elevated toilet seat or a handicapped toilet?: Yes    Cognitive Function:        10/15/2022    2:44 PM 10/10/2021   10:13 AM 09/30/2018    9:59 AM  6CIT Screen  What Year? 0 points 0 points 0 points  What month? 0 points 0 points 0 points  What time? 0 points 0 points 0 points  Count back from 20 0 points  0 points  Months in reverse 0 points 0 points 0 points  Repeat phrase 0 points  0 points  Total Score 0 points  0 points    Immunizations Immunization History  Administered Date(s) Administered   Fluad Quad(high Dose 65+) 09/04/2018, 09/23/2021   Influenza, High Dose Seasonal PF 10/21/2017   Influenza,inj,Quad PF,6+ Mos 10/26/2019   Influenza-Unspecified 09/30/2021   PFIZER(Purple Top)SARS-COV-2 Vaccination 02/13/2019, 03/10/2019, 10/20/2019, 06/05/2020   PNEUMOCOCCAL CONJUGATE-20 10/17/2021   Pfizer Covid-19 Vaccine Bivalent Booster 28yrs & up 09/29/2020, 09/23/2021   Pfizer(Comirnaty)Fall Seasonal Vaccine 12 years and  older 09/22/2022   Rsv, Bivalent, Protein Subunit Rsvpref,pf Verdis Frederickson) 12/03/2021   Zoster Recombinant(Shingrix) 09/30/2021    TDAP status: Up to date  Flu Vaccine status: Due, Education has been provided regarding the importance of this vaccine. Advised may receive this vaccine at local pharmacy or Health Dept. Aware to provide a copy of the vaccination record if obtained from local pharmacy or Health Dept. Verbalized acceptance and understanding.  Pneumococcal vaccine status: Up to  date  Covid-19 vaccine status: Completed vaccines  Qualifies for Shingles Vaccine? Yes   Zostavax completed No   Shingrix Completed?: Yes  Screening Tests Health Maintenance  Topic Date Due   DTaP/Tdap/Td (1 - Tdap) Never done   Zoster Vaccines- Shingrix (2 of 2) 11/25/2021   INFLUENZA VACCINE  08/07/2022   Medicare Annual Wellness (AWV)  10/11/2022   COVID-19 Vaccine (8 - 2023-24 season) 11/17/2022   MAMMOGRAM  05/06/2023   Fecal DNA (Cologuard)  12/11/2023   Pneumonia Vaccine 42+ Years old  Completed   DEXA SCAN  Completed   Hepatitis C Screening  Completed   HPV VACCINES  Aged Out    Health Maintenance  Health Maintenance Due  Topic Date Due   DTaP/Tdap/Td (1 - Tdap) Never done   Zoster Vaccines- Shingrix (2 of 2) 11/25/2021   INFLUENZA VACCINE  08/07/2022   Medicare Annual Wellness (AWV)  10/11/2022    Colorectal cancer screening: Type of screening: Cologuard. Completed 12/2020. Repeat every 3 years  Mammogram status: Completed 04/2022. Repeat every year  Bone Density status: Completed 03/2019. Results reflect: Bone density results: OSTEOPOROSIS. Repeat every 2 years. Order placed 06/2022  Lung Cancer Screening: (Low Dose CT Chest recommended if Age 44-80 years, 20 pack-year currently smoking OR have quit w/in 15years.) does not qualify.     Additional Screening:  Hepatitis C Screening: does qualify; Completed 11/2017  Vision Screening: Recommended annual ophthalmology exams for early detection of glaucoma and other disorders of the eye. Is the patient up to date with their annual eye exam?  Yes  Who is the provider or what is the name of the office in which the patient attends annual eye exams? [facility] If pt is not established with a provider, would they like to be referred to a provider to establish care? No .   Dental Screening: Recommended annual dental exams for proper oral hygiene   Community Resource Referral / Chronic Care Management: CRR  required this visit?  No   CCM required this visit?  No     Plan:     I have personally reviewed and noted the following in the patient's chart:   Medical and social history Use of alcohol, tobacco or illicit drugs  Current medications and supplements including opioid prescriptions. Patient is not currently taking opioid prescriptions. Functional ability and status Nutritional status Physical activity Advanced directives List of other physicians Hospitalizations, surgeries, and ER visits in previous 12 months Vitals Screenings to include cognitive, depression, and falls Referrals and appointments  In addition, I have reviewed and discussed with patient certain preventive protocols, quality metrics, and best practice recommendations. A written personalized care plan for preventive services as well as general preventive health recommendations were provided to patient.     Sydell Axon, LPN   16/01/958   After Visit Summary: (MyChart) Due to this being a telephonic visit, the after visit summary with patients personalized plan was offered to patient via MyChart   Nurse Notes: None

## 2022-10-15 NOTE — Patient Instructions (Signed)
Ms. Nabi , Thank you for taking time to come for your Medicare Wellness Visit. I appreciate your ongoing commitment to your health goals. Please review the following plan we discussed and let me know if I can assist you in the future.   Referrals/Orders/Follow-Ups/Clinician Recommendations: Vaccines have been updated. Remember to call and schedule your Bone density it was ordered 06/2022  This is a list of the screening recommended for you and due dates:  Health Maintenance  Topic Date Due   Flu Shot  08/07/2022   COVID-19 Vaccine (8 - 2023-24 season) 11/17/2022   Mammogram  05/06/2023   Medicare Annual Wellness Visit  10/15/2023   Cologuard (Stool DNA test)  12/11/2023   DTaP/Tdap/Td vaccine (2 - Td or Tdap) 08/05/2032   Pneumonia Vaccine  Completed   DEXA scan (bone density measurement)  Completed   Hepatitis C Screening  Completed   Zoster (Shingles) Vaccine  Completed   HPV Vaccine  Aged Out    Advanced directives: (Copy Requested) Please bring a copy of your health care power of attorney and living will to the office to be added to your chart at your convenience.  Next Medicare Annual Wellness Visit scheduled for next year: Yes 10/20/22 @ 9:00

## 2022-10-15 NOTE — Telephone Encounter (Signed)
Patient called and wanted to let you know the dates she had her shingles shot. She had her 1st one on Wednesday July 31st at [facility] and also had teckna shot. And her 2nd one was Monday September 30th. She wanted to let Mrs. Rene Kocher know

## 2022-10-20 ENCOUNTER — Encounter: Payer: MEDICARE | Admitting: Family Medicine

## 2022-11-20 DIAGNOSIS — Z23 Encounter for immunization: Secondary | ICD-10-CM | POA: Diagnosis not present

## 2022-12-09 DIAGNOSIS — H47011 Ischemic optic neuropathy, right eye: Secondary | ICD-10-CM | POA: Diagnosis not present

## 2022-12-16 DIAGNOSIS — H47011 Ischemic optic neuropathy, right eye: Secondary | ICD-10-CM | POA: Diagnosis not present

## 2022-12-16 DIAGNOSIS — H35373 Puckering of macula, bilateral: Secondary | ICD-10-CM | POA: Diagnosis not present

## 2022-12-16 DIAGNOSIS — H2513 Age-related nuclear cataract, bilateral: Secondary | ICD-10-CM | POA: Diagnosis not present

## 2022-12-16 DIAGNOSIS — H40003 Preglaucoma, unspecified, bilateral: Secondary | ICD-10-CM | POA: Diagnosis not present

## 2023-01-08 ENCOUNTER — Ambulatory Visit
Admission: RE | Admit: 2023-01-08 | Discharge: 2023-01-08 | Disposition: A | Discharge status: Home / Self Care | Payer: MEDICARE | Source: Ambulatory Visit | Attending: Emergency Medicine | Admitting: Emergency Medicine

## 2023-01-08 VITALS — BP 136/87 | HR 82 | Temp 99.2°F | Resp 18

## 2023-01-08 DIAGNOSIS — J069 Acute upper respiratory infection, unspecified: Secondary | ICD-10-CM | POA: Diagnosis not present

## 2023-01-08 MED ORDER — AZITHROMYCIN 250 MG PO TABS: 250.0000 mg | ORAL_TABLET | Freq: Every day | ORAL | 0 refills | Status: DC

## 2023-01-08 NOTE — Discharge Instructions (Addendum)
 Take the Zithromax as directed.  Follow up with your primary care provider if your symptoms are not improving.

## 2023-01-08 NOTE — ED Triage Notes (Signed)
 Patient to Urgent Care with complaints of  cough, fatigue, chest soreness from coughing.  Symptoms started Monday evening. Fever 101 on Tuesday.   Meds: Nyquil, tylenol

## 2023-01-08 NOTE — ED Provider Notes (Signed)
 Monica Salazar    CSN: [id number] Arrival date & time: 01/08/23  0807      History   Chief Complaint Chief Complaint  Patient presents with   Cough    I think I have bronchitis - Entered by patient    HPI Monica Salazar is a 73 y.o. female.  Patient presents with 3 day history of fever, congestion, cough, fatigue.  Patient reports her chest is sore from coughing so much.  Tmax 101.  No OTC medications taken today; took Tylenol  and Nyquil last night.  No sore throat, shortness of breath, vomiting, diarrhea.  The history is provided by the patient and medical records.    Past Medical History:  Diagnosis Date   Arthritis    hands   COVID-19    09/2021   Hearing loss    left ear 09/27/16 MR, CT negative etiology she had rash neck 2 days before hearing loss left ear so ? virus   History of chicken pox    Migraine    tries Excedrine migraine-normally sees swigly lines/distorted vision unable to talk, left arm recently numb; senstive light and sound   Osteoporosis    UTI (urinary tract infection)    Vertigo    Vitamin D  deficiency     Patient Active Problem List   Diagnosis Date Noted   Nonspecific syndrome suggestive of viral illness 08/07/2022   Breast cancer screening by mammogram 07/02/2022   Postmenopausal estrogen deficiency 07/02/2022   BPPV (benign paroxysmal positional vertigo) 05/14/2022   Screening mammogram, encounter for 04/13/2022   Primary hypertension 04/10/2022   Cough 04/09/2022   Hip pain, chronic, right 06/14/2020   Cervicalgia 06/14/2020   Decreased peripheral vision 05/24/2019   Arthritis of right hand 05/24/2019   Bursitis of hip 05/24/2019   Vertigo 03/16/2019   B12 deficiency 11/23/2018   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   Osteoporosis 11/23/2018   Hyperlipidemia 11/23/2018   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/2018   Sensorineural hearing loss (SNHL) of left ear with unrestricted hearing of right ear  03/25/2018   History of migraine 10/21/2017   Arthritis 10/21/2017   SK (seborrheic keratosis) 10/21/2017   Vitamin D  deficiency 10/21/2017   History of osteoporosis 10/21/2017   Sensorineural hearing loss (SNHL) of left ear 10/21/2017    Past Surgical History:  Procedure Laterality Date   BREAST BIOPSY Left    excision   CESAREAN SECTION     1986   hospitalization     2006 untx'ed UTI   TONSILLECTOMY  1966    OB History   No obstetric history on file.      Home Medications    Prior to Admission medications   Medication Sig Start Date End Date Taking? Authorizing Provider  azithromycin  (ZITHROMAX ) 250 MG tablet Take 1 tablet (250 mg total) by mouth daily. Take first 2 tablets together, then 1 every day until finished. 01/08/23  Yes Corlis Burnard DEL, NP  amLODipine  (NORVASC ) 2.5 MG tablet Take 1 tablet (2.5 mg total) by mouth daily. 08/13/22   Walsh, Tanya, MD  Cholecalciferol (VITAMIN D3) 50 MCG (2000 UT) TABS Take by mouth.    [provider]  Multiple Vitamins-Minerals (CENTRUM SILVER ULTRA WOMENS) TABS Take by mouth daily.    [provider]  Plant Sterols and Stanols (CHOLESTOFF PO) Take by mouth daily.    [provider]  Turmeric (QC TUMERIC COMPLEX PO) Take by mouth.    [provider]    Family History Family History  Problem Relation Age of Onset   Lung cancer Mother    Early death Mother    Cancer Mother        lung cancer smoker    Heart attack Father    Stroke Father    Heart disease Father    Arthritis Brother        rheumatoid   Migraines Brother    Migraines Daughter    Diabetes Maternal Grandmother        died comp. DM 2    Early death Maternal Grandmother    Hypertension Maternal Grandmother    Early death Paternal Grandfather    Hearing loss Paternal Grandfather     Social History Social History   Tobacco Use   Smoking status: Never   Smokeless tobacco: Never  Vaping Use   Vaping status: Never Used   Substance Use Topics   Alcohol use: Never   Drug use: Never     Allergies   Boniva [ibandronic acid]   Review of Systems Review of Systems  Constitutional:  Positive for fatigue and fever.  HENT:  Positive for congestion, postnasal drip and rhinorrhea. Negative for ear pain and sore throat.   Respiratory:  Positive for cough. Negative for shortness of breath.      Physical Exam Triage Vital Signs ED Triage Vitals  Encounter Vitals Group     BP      Systolic BP Percentile      Diastolic BP Percentile      Pulse      Resp      Temp      Temp src      SpO2      Weight      Height      Head Circumference      Peak Flow      Pain Score      Pain Loc      Pain Education      Exclude from Growth Chart    No data found.  Updated Vital Signs BP 136/87   Pulse 82   Temp 99.2 F (37.3 C)   Resp 18   SpO2 96%   Visual Acuity Right Eye Distance:   Left Eye Distance:   Bilateral Distance:    Right Eye Near:   Left Eye Near:    Bilateral Near:     Physical Exam Constitutional:      General: She is not in acute distress. HENT:     Right Ear: Tympanic membrane normal.     Left Ear: Tympanic membrane normal.     Nose: Rhinorrhea present.     Mouth/Throat:     Mouth: Mucous membranes are moist.     Pharynx: Oropharynx is clear.  Cardiovascular:     Rate and Rhythm: Normal rate and regular rhythm.     Heart sounds: Normal heart sounds.  Pulmonary:     Effort: Pulmonary effort is normal. No respiratory distress.     Breath sounds: Normal breath sounds.     Comments: Frequent wet sounding cough but lungs are clear at this time. Neurological:     Mental Status: She is alert.      UC Treatments / Results  Labs (all labs ordered are listed, but only abnormal results are displayed) Labs Reviewed - No data to display  EKG   Radiology No results found.  Procedures Procedures (including critical care time)  Medications Ordered in UC  Medications -  No data to display  Initial Impression / Assessment and Plan / UC Course  I have reviewed the triage vital signs and the nursing notes.  Pertinent labs & imaging results that were available during my care of the patient were reviewed by me and considered in my medical decision making (see chart for details).    Acute upper respiratory infection.  Patient has a frequent wet sounding cough.  Her lungs are clear at this time and O2 sat is 96% on room air.  Treating today with Zithromax .  She declines albuterol  inhaler or prednisone .  Education provided on upper respiratory infection.  Instructed patient to follow up with her PCP if her symptoms are not improving.  She agrees to plan of care.   Final Clinical Impressions(s) / UC Diagnoses   Final diagnoses:  Acute upper respiratory infection     Discharge Instructions      Take the Zithromax  as directed.  Follow-up with your primary care provider if your symptoms are not improving.      ED Prescriptions     Medication Sig Dispense Auth. Provider   azithromycin  (ZITHROMAX ) 250 MG tablet Take 1 tablet (250 mg total) by mouth daily. Take first 2 tablets together, then 1 every day until finished. 6 tablet Corlis Burnard DEL, NP      PDMP not reviewed this encounter.   Corlis Burnard DEL, NP 01/08/23 [phone]

## 2023-01-26 ENCOUNTER — Telehealth: Payer: Self-pay

## 2023-01-26 NOTE — Telephone Encounter (Signed)
Copied from CRM [id number]. Topic: Clinical - Medical Advice >> Jan 26, 2023 11:20 AM Sonny Dandy B wrote: Reason for CRM:  pt called to find out if her provider could give her  a travel pack. Pt is going to [place] on Saturday. Requesting if her provider would send in a request for a travel pack. Please call pt back at [phone]

## 2023-01-26 NOTE — Telephone Encounter (Signed)
Copied from CRM [id number]. Topic: Clinical - Medication Question >> Jan 26, 2023  1:15 PM Corin V wrote: Reason for CRM: Patient called to clarify information regarding her call earlier regarding the travel medication pack request. She didn't know if she should see Dr. Clent Ridges for a pre-travel pack. A medical provider friend mentioned prescribing a Covid Test, Paxlovid, and antibiotics to take if needed while over in [place] for 2 weeks.

## 2023-01-26 NOTE — Telephone Encounter (Signed)
Called pt and she stated that she will call them to make an appointment.

## 2023-01-27 ENCOUNTER — Telehealth: Payer: Self-pay

## 2023-01-27 NOTE — Telephone Encounter (Signed)
Copied from CRM [id number]. Topic: Appointments - Appointment Scheduling >> Jan 27, 2023 10:45 AM Donita Brooks wrote: Pt is looking to get a pre-travel pack that includes -  Covid Test, Paxlovid, and antibiotics. Pt would need a appointment with provider. Provider doesn't have any openings until Feb 11. Pt needs to be seen sooner due to leaving out of town Saturday  I spoke with patient and let her know that Dr. Marikay Alar has openings tomorrow and he will be glad to see her, but he cannot guarantee that he will prescribe the medications she has requested.  Patient declined scheduling the appointment with Dr. Birdie Sons.  Patient states she has some amoxicillin that expired in 2020, and she would like to know if it would be safe to still take it or should she discard it.  Patient states she is considering taking it in her travel bag just in case she may need an antibiotic, but she would like to make sure it will not hurt her to take.

## 2023-01-28 NOTE — Telephone Encounter (Signed)
Called pt and informed pt of this information.

## 2023-02-04 ENCOUNTER — Other Ambulatory Visit: Payer: Self-pay | Admitting: Family Medicine

## 2023-02-04 DIAGNOSIS — I10 Essential (primary) hypertension: Secondary | ICD-10-CM

## 2023-03-06 DIAGNOSIS — H6981 Other specified disorders of Eustachian tube, right ear: Secondary | ICD-10-CM | POA: Diagnosis not present

## 2023-03-16 DIAGNOSIS — Z08 Encounter for follow-up examination after completed treatment for malignant neoplasm: Secondary | ICD-10-CM | POA: Diagnosis not present

## 2023-03-16 DIAGNOSIS — D2272 Melanocytic nevi of left lower limb, including hip: Secondary | ICD-10-CM | POA: Diagnosis not present

## 2023-03-16 DIAGNOSIS — D2271 Melanocytic nevi of right lower limb, including hip: Secondary | ICD-10-CM | POA: Diagnosis not present

## 2023-03-16 DIAGNOSIS — L821 Other seborrheic keratosis: Secondary | ICD-10-CM | POA: Diagnosis not present

## 2023-03-16 DIAGNOSIS — Z85828 Personal history of other malignant neoplasm of skin: Secondary | ICD-10-CM | POA: Diagnosis not present

## 2023-03-16 DIAGNOSIS — D2262 Melanocytic nevi of left upper limb, including shoulder: Secondary | ICD-10-CM | POA: Diagnosis not present

## 2023-03-16 DIAGNOSIS — D225 Melanocytic nevi of trunk: Secondary | ICD-10-CM | POA: Diagnosis not present

## 2023-03-16 DIAGNOSIS — D2261 Melanocytic nevi of right upper limb, including shoulder: Secondary | ICD-10-CM | POA: Diagnosis not present

## 2023-04-02 DIAGNOSIS — H6981 Other specified disorders of Eustachian tube, right ear: Secondary | ICD-10-CM | POA: Diagnosis not present

## 2023-04-16 ENCOUNTER — Telehealth: Payer: Self-pay

## 2023-04-16 NOTE — Telephone Encounter (Signed)
 Noted.

## 2023-04-16 NOTE — Telephone Encounter (Signed)
 Patient's husband, Sissy Hoff, asked that I call patient to find out who she would like to schedule her transfer of care appointment with, as Dr. Dana Allan will be leaving our practice at the end of June.  I spoke with patient who states she would like to discuss who she should transfer her care to with Dr. Dana Allan at her visit on 05/12/2023.  Patient's husband, Sissy Hoff, states he would like to transfer his care to the same provider Minerva Ends chooses.

## 2023-05-05 ENCOUNTER — Other Ambulatory Visit (INDEPENDENT_AMBULATORY_CARE_PROVIDER_SITE_OTHER): Payer: MEDICARE

## 2023-05-05 DIAGNOSIS — E559 Vitamin D deficiency, unspecified: Secondary | ICD-10-CM

## 2023-05-05 DIAGNOSIS — E785 Hyperlipidemia, unspecified: Secondary | ICD-10-CM | POA: Diagnosis not present

## 2023-05-05 DIAGNOSIS — I10 Essential (primary) hypertension: Secondary | ICD-10-CM

## 2023-05-05 LAB — COMPREHENSIVE METABOLIC PANEL WITH GFR
ALT: 13 U/L (ref 0–35)
AST: 19 U/L (ref 0–37)
Albumin: 4.2 g/dL (ref 3.5–5.2)
Alkaline Phosphatase: 55 U/L (ref 39–117)
BUN: 11 mg/dL (ref 6–23)
CO2: 30 meq/L (ref 19–32)
Calcium: 9.3 mg/dL (ref 8.4–10.5)
Chloride: 103 meq/L (ref 96–112)
Creatinine, Ser: 0.66 mg/dL (ref 0.40–1.20)
GFR: 87.72 mL/min (ref >60.00)
Glucose, Bld: 100 mg/dL — ABNORMAL HIGH (ref 70–99)
Potassium: 3.6 meq/L (ref 3.5–5.1)
Sodium: 140 meq/L (ref 135–145)
Total Bilirubin: 0.8 mg/dL (ref 0.2–1.2)
Total Protein: 6.8 g/dL (ref 6.0–8.3)

## 2023-05-05 LAB — LIPID PANEL
Cholesterol: 230 mg/dL — ABNORMAL HIGH (ref 0–200)
HDL: 62.5 mg/dL (ref >39.00)
LDL Cholesterol: 144 mg/dL — ABNORMAL HIGH (ref 0–99)
NonHDL: 167
Total CHOL/HDL Ratio: 4
Triglycerides: 114 mg/dL (ref 0.0–149.0)
VLDL: 22.8 mg/dL (ref 0.0–40.0)

## 2023-05-05 LAB — VITAMIN D 25 HYDROXY (VIT D DEFICIENCY, FRACTURES): VITD: 49.13 ng/mL (ref 30.00–100.00)

## 2023-05-12 ENCOUNTER — Ambulatory Visit (INDEPENDENT_AMBULATORY_CARE_PROVIDER_SITE_OTHER): Payer: MEDICARE | Admitting: Family Medicine

## 2023-05-12 ENCOUNTER — Encounter: Payer: Self-pay | Admitting: Family Medicine

## 2023-05-12 VITALS — BP 124/68 | HR 86 | Temp 98.5°F | Resp 20 | Ht 61.0 in | Wt 131.2 lb

## 2023-05-12 DIAGNOSIS — E559 Vitamin D deficiency, unspecified: Secondary | ICD-10-CM | POA: Diagnosis not present

## 2023-05-12 DIAGNOSIS — I10 Essential (primary) hypertension: Secondary | ICD-10-CM | POA: Diagnosis not present

## 2023-05-12 DIAGNOSIS — Z0000 Encounter for general adult medical examination without abnormal findings: Secondary | ICD-10-CM

## 2023-05-12 DIAGNOSIS — M810 Age-related osteoporosis without current pathological fracture: Secondary | ICD-10-CM

## 2023-05-12 DIAGNOSIS — E785 Hyperlipidemia, unspecified: Secondary | ICD-10-CM | POA: Diagnosis not present

## 2023-05-12 DIAGNOSIS — Z1231 Encounter for screening mammogram for malignant neoplasm of breast: Secondary | ICD-10-CM

## 2023-05-12 DIAGNOSIS — E538 Deficiency of other specified B group vitamins: Secondary | ICD-10-CM | POA: Diagnosis not present

## 2023-05-12 MED ORDER — VITAMIN D (ERGOCALCIFEROL) 1.25 MG (50000 UNIT) PO CAPS: 50000.0000 [IU] | ORAL_CAPSULE | ORAL | 1 refills | Status: DC

## 2023-05-12 MED ORDER — FLUTICASONE PROPIONATE 50 MCG/ACT NA SUSP: 1.0000 | Freq: Two times a day (BID) | NASAL | 11 refills | Status: DC

## 2023-05-12 MED ORDER — AMLODIPINE BESYLATE 2.5 MG PO TABS: 2.5000 mg | ORAL_TABLET | Freq: Every day | ORAL | 3 refills | Status: DC

## 2023-05-12 MED ORDER — ATORVASTATIN CALCIUM 10 MG PO TABS: 10.0000 mg | ORAL_TABLET | Freq: Every day | ORAL | 3 refills | Status: DC

## 2023-05-12 NOTE — Patient Instructions (Addendum)
 It was a pleasure meeting you today. Thank you for allowing me to take part in your health care.  Our goals for today as we discussed include:  Refills sent for requested medication  Start Lipitor 10 mg daily for high cholesterol Notify MD if any muscle weakness  Referral sent for Mammogram and Dexa scan. Please call to schedule appointment. [facility] [address] [place], [place] [address] [phone]    Vitamin D  low normal.  Will send in prescription for high dose Vitamin 1.25 mg.  Take 1 tablet one time a week for 6 months    This is a list of the screening recommended for you and due dates:  Health Maintenance  Topic Date Due   COVID-19 Vaccine (9 - Pfizer risk 2024-25 season) 03/22/2023   Mammogram  05/06/2023   Flu Shot  08/07/2023   Medicare Annual Wellness Visit  10/15/2023   Cologuard (Stool DNA test)  12/11/2023   DTaP/Tdap/Td vaccine (3 - Td or Tdap) 08/05/2032   Pneumonia Vaccine  Completed   DEXA scan (bone density measurement)  Completed   Hepatitis C Screening  Completed   Zoster (Shingles) Vaccine  Completed   HPV Vaccine  Aged Out   Meningitis B Vaccine  Aged Out     If you have any questions or concerns, please do not hesitate to call the office at [phone].  I look forward to our next visit and until then take care and stay safe.  Regards,   Valli Gaw, MD   [facility]

## 2023-05-12 NOTE — Progress Notes (Signed)
 SUBJECTIVE:   Chief Complaint  Patient presents with   Annual Exam   HPI Presents for annual physical  Discussed the use of AI scribe software for clinical note transcription with the patient, who gave verbal consent to proceed.  History of Present Illness Monica Salazar is a 73 year old female who presents for an annual physical exam.  She has a history of high cholesterol, first noted in 2023, and has been taking red yeast rice inconsistently. Her cholesterol levels have not decreased and have been increasing. She is concerned about potential side effects of statin therapy, such as muscle aches and pains.  She has a history of high blood pressure and is currently taking amlodipine  approximately five nights out of seven. She requests a refill of this medication as she is planning a trip to [place] for three and a half weeks.  She has a history of osteoporosis and has previously tried Boniva, which she could not tolerate due to adverse effects. She is not currently taking vitamin D  regularly. She has had a DEXA scan in the past, which confirmed osteoporosis.  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 She had pneumonia before a trip to [place] but recovered before the flight. She uses a prescribed nasal spray, although she sometimes forgets to use it in the morning.  She notes a skin lesion that has been present for a long time but seems to have changed slightly. She has seen a dermatologist in the past for this issue.  No current chest pain or other acute symptoms. Reports muscle aches and pains but attributes them to normal aging.  PERTINENT PMH / PSH: As above  OBJECTIVE:  BP 124/68   Pulse 86   Temp 98.5 F (36.9 C)   Resp 20   Ht 5\' 1"  (1.549 m)   Wt 131 lb 4 oz (59.5 kg)   SpO2 98%   BMI 24.80 kg/m    Physical Exam Vitals reviewed.  Constitutional:      General: She is not in acute distress.    Appearance: She is not  ill-appearing.  HENT:     Head: Normocephalic.     Right Ear: Tympanic membrane, ear canal and external ear normal.     Left Ear: Tympanic membrane, ear canal and external ear normal.     Nose: Nose normal.     Mouth/Throat:     Mouth: Mucous membranes are moist.  Eyes:     Extraocular Movements: Extraocular movements intact.     Conjunctiva/sclera: Conjunctivae normal.     Pupils: Pupils are equal, round, and reactive to light.  Neck:     Thyroid : No thyromegaly or thyroid  tenderness.     Vascular: No carotid bruit.  Cardiovascular:     Rate and Rhythm: Normal rate and regular rhythm.     Pulses: Normal pulses.     Heart sounds: Normal heart sounds.  Pulmonary:     Effort: Pulmonary effort is normal.     Breath sounds: Normal breath sounds.  Abdominal:     General: Bowel sounds are normal. There is no distension.     Palpations: Abdomen is soft.     Tenderness: There is no abdominal tenderness. There is no right CVA tenderness, left CVA tenderness, guarding or rebound.  Musculoskeletal:        General: Normal range of motion.     Cervical back: Normal range of motion.     Right lower leg:  No edema.     Left lower leg: No edema.  Lymphadenopathy:     Cervical: No cervical adenopathy.  Skin:    Capillary Refill: Capillary refill takes less than 2 seconds.  Neurological:     General: No focal deficit present.     Mental Status: She is alert and oriented to person, place, and time. Mental status is at baseline.     Motor: No weakness.  Psychiatric:        Mood and Affect: Mood normal.        Behavior: Behavior normal.        Thought Content: Thought content normal.        Judgment: Judgment normal.           05/12/2023    8:49 AM 10/15/2022    2:38 PM 07/02/2022    9:55 AM 05/02/2022    8:22 AM 04/09/2022    4:14 PM  Depression screen PHQ 2/9  Decreased Interest 0 0 0 0 0  Down, Depressed, Hopeless 0 0 0 0 0  PHQ - 2 Score 0 0 0 0 0  Altered sleeping 0 0 0 0    Tired, decreased energy 0 0 0 0   Change in appetite 0 0 0 0   Feeling bad or failure about yourself  0 0 0 0   Trouble concentrating 0 0 0 0   Moving slowly or fidgety/restless 0 0 0 0   Suicidal thoughts 0 0 0 0   PHQ-9 Score 0 0 0 0   Difficult doing work/chores Not difficult at all Not difficult at all Not difficult at all        05/12/2023    8:49 AM 07/02/2022    9:56 AM 05/02/2022    8:23 AM 04/09/2022    4:14 PM  GAD 7 : Generalized Anxiety Score  Nervous, Anxious, on Edge 0 0 0 0  Control/stop worrying 0 0 0 0  Worry too much - different things 0 0 0 0  Trouble relaxing 0 0 0 0  Restless 0 0 0 0  Easily annoyed or irritable 0 0 0 0  Afraid - awful might happen 0 0 0 0  Total GAD 7 Score 0 0 0 0  Anxiety Difficulty Not difficult at all Not difficult at all  Not difficult at all    ASSESSMENT/PLAN:  Annual physical exam Assessment & Plan: Annual physical examination conducted.  - Vaccinations up to date.  - Order mammogram for annual screening. - Recommend regular self breast exams - Order DEXA scan to assess osteoporosis status. - Check vitamin D  levels and consider supplementation if needed.    Primary hypertension Assessment & Plan: Chronic.  Stable.  Asymptomatic.   Continue to monitor at home. Refill Amlodipine  2.5 mg daily        Orders: -     amLODIPine  Besylate; Take 1 tablet (2.5 mg total) by mouth daily.  Dispense: 90 tablet; Refill: 3  Breast cancer screening by mammogram -     3D Screening Mammogram, Left and Right  Hyperlipidemia, unspecified hyperlipidemia type Assessment & Plan: Cholesterol levels elevated, increasing cardiovascular risk. Inconsistent with red yeast rice supplement. Statin therapy recommended for cholesterol management and reduced risk of myocardial infarction and cerebrovascular accident. Starting statin therapy now allows monitoring for side effects before travel. - Start atorvastatin  10 mg daily in the evening. -  Monitor for side effects, particularly muscle aches and pains.  Orders: -  Atorvastatin  Calcium ; Take 1 tablet (10 mg total) by mouth daily.  Dispense: 90 tablet; Refill: 3  Vitamin D  deficiency -     Vitamin D  (Ergocalciferol ); Take 1 capsule (50,000 Units total) by mouth every 7 (seven) days.  Dispense: 12 capsule; Refill: 1  Osteoporosis without current pathological fracture, unspecified osteoporosis type Assessment & Plan: Osteoporosis with previous intolerance to Boniva. Alternative treatments considered pending DEXA scan results. Prolia is an alternative treatment option. - Order DEXA scan to assess current osteoporosis status. - Consider Prolia as an alternative treatment option if DEXA scan indicates need. - Previous T score -2.5 on Dexa 2021  Orders: -     Vitamin D  (Ergocalciferol ); Take 1 capsule (50,000 Units total) by mouth every 7 (seven) days.  Dispense: 12 capsule; Refill: 1  B12 deficiency Assessment & Plan: Check Vitamin B 12 level   Other orders -     Fluticasone  Propionate; Place 1 spray into both nostrils 2 (two) times daily.  Dispense: 11.1 mL; Refill: 11    PDMP reviewed  Return if symptoms worsen or fail to improve.  Valli Gaw, MD

## 2023-05-14 DIAGNOSIS — B078 Other viral warts: Secondary | ICD-10-CM | POA: Diagnosis not present

## 2023-05-14 DIAGNOSIS — L570 Actinic keratosis: Secondary | ICD-10-CM | POA: Diagnosis not present

## 2023-05-14 DIAGNOSIS — R238 Other skin changes: Secondary | ICD-10-CM | POA: Diagnosis not present

## 2023-05-14 DIAGNOSIS — L821 Other seborrheic keratosis: Secondary | ICD-10-CM | POA: Diagnosis not present

## 2023-05-17 ENCOUNTER — Encounter: Payer: Self-pay | Admitting: Family Medicine

## 2023-05-17 NOTE — Assessment & Plan Note (Signed)
 Chronic.  Stable.  Asymptomatic.   Continue to monitor at home. Refill Amlodipine  2.5 mg daily

## 2023-05-17 NOTE — Assessment & Plan Note (Addendum)
 Annual physical examination conducted.  - Vaccinations up to date.  - Order mammogram for annual screening. - Recommend regular self breast exams - Order DEXA scan to assess osteoporosis status. - Check vitamin D  levels and consider supplementation if needed.

## 2023-05-17 NOTE — Assessment & Plan Note (Addendum)
 Osteoporosis with previous intolerance to Boniva. Alternative treatments considered pending DEXA scan results. Prolia is an alternative treatment option. - Order DEXA scan to assess current osteoporosis status. - Consider Prolia as an alternative treatment option if DEXA scan indicates need. - Previous T score -2.5 on Dexa 2021

## 2023-05-17 NOTE — Assessment & Plan Note (Signed)
 Check Vitamin B 12 level

## 2023-05-17 NOTE — Assessment & Plan Note (Signed)
 Cholesterol levels elevated, increasing cardiovascular risk. Inconsistent with red yeast rice supplement. Statin therapy recommended for cholesterol management and reduced risk of myocardial infarction and cerebrovascular accident. Starting statin therapy now allows monitoring for side effects before travel. - Start atorvastatin  10 mg daily in the evening. - Monitor for side effects, particularly muscle aches and pains.

## 2023-05-22 ENCOUNTER — Encounter: Payer: Self-pay | Admitting: Family Medicine

## 2023-05-22 ENCOUNTER — Other Ambulatory Visit: Payer: Self-pay | Admitting: Family Medicine

## 2023-05-22 NOTE — Progress Notes (Signed)
 Patient reports muscle pain from Lipitor 10 mg  Discontinued medication  Monica Gaw, MD

## 2023-06-25 DIAGNOSIS — H2513 Age-related nuclear cataract, bilateral: Secondary | ICD-10-CM | POA: Diagnosis not present

## 2023-06-25 DIAGNOSIS — H04123 Dry eye syndrome of bilateral lacrimal glands: Secondary | ICD-10-CM | POA: Diagnosis not present

## 2023-06-25 DIAGNOSIS — H40003 Preglaucoma, unspecified, bilateral: Secondary | ICD-10-CM | POA: Diagnosis not present

## 2023-06-30 ENCOUNTER — Ambulatory Visit
Admission: RE | Admit: 2023-06-30 | Discharge: 2023-06-30 | Disposition: A | Discharge status: Home / Self Care | Payer: MEDICARE | Source: Ambulatory Visit | Attending: Family Medicine | Admitting: Family Medicine

## 2023-06-30 DIAGNOSIS — Z1231 Encounter for screening mammogram for malignant neoplasm of breast: Secondary | ICD-10-CM | POA: Diagnosis not present

## 2023-06-30 DIAGNOSIS — M810 Age-related osteoporosis without current pathological fracture: Secondary | ICD-10-CM | POA: Diagnosis not present

## 2023-06-30 DIAGNOSIS — Z780 Asymptomatic menopausal state: Secondary | ICD-10-CM

## 2023-07-02 ENCOUNTER — Ambulatory Visit: Payer: Self-pay | Admitting: Family Medicine

## 2023-07-02 DIAGNOSIS — M810 Age-related osteoporosis without current pathological fracture: Secondary | ICD-10-CM

## 2023-07-02 MED ORDER — DENOSUMAB 60 MG/ML ~~LOC~~ SOSY: 60.0000 mg | PREFILLED_SYRINGE | Freq: Once | SUBCUTANEOUS | Status: DC

## 2023-07-08 ENCOUNTER — Telehealth: Payer: Self-pay

## 2023-07-08 NOTE — Telephone Encounter (Signed)
 Please confirm if pt was agreeable to Prolia  injection for Osteoporosis. We can send her some information to read over to her mychart if she needs it. But need to know is she wants me to proceed.  Thanks

## 2023-07-08 NOTE — Telephone Encounter (Signed)
 Pt wanted to know why she was billed 300 and something for visit 05/12/2023. She was thinking it was the way it was coded . Please advise

## 2023-07-09 NOTE — Telephone Encounter (Signed)
 Sent pt a mychart and she seen the message.

## 2023-07-15 ENCOUNTER — Telehealth: Payer: Self-pay

## 2023-07-15 MED ORDER — DENOSUMAB 60 MG/ML ~~LOC~~ SOSY: 60.0000 mg | PREFILLED_SYRINGE | Freq: Once | SUBCUTANEOUS | Status: DC

## 2023-07-15 MED ORDER — DENOSUMAB 60 MG/ML ~~LOC~~ SOSY: 60.0000 mg | PREFILLED_SYRINGE | Freq: Once | SUBCUTANEOUS | Status: AC

## 2023-07-15 NOTE — Telephone Encounter (Signed)
 Prolia VOB initiated via [email or web address]  Next Prolia inj DUE: NEW START

## 2023-07-15 NOTE — Telephone Encounter (Signed)
 Monica Salazar

## 2023-07-15 NOTE — Telephone Encounter (Signed)
 Pt ready for scheduling for PROLIA  on or after : 07/15/23  Option# 1: Buy/Bill (Office supplied medication)  Out-of-pocket cost due at time of clinic visit: $0  Number of injection/visits approved: ---  Primary: MEDICARE Prolia  co-insurance: 0% Admin fee co-insurance: 0%  Secondary: UHC A[employer] MEDSUP Prolia  co-insurance:  Admin fee co-insurance:   Medical Benefit Details: Date Benefits were checked: 07/15/23 Deductible: $257 Met of $257 Required/ Coinsurance: 0%/ Admin Fee: 0%  Prior Auth: N/A PA# Expiration Date:   # of doses approved: ----------------------------------------------------------------------- Option# 2- Med Obtained from pharmacy:  Pharmacy benefit: Copay $--- (Paid to pharmacy) Admin Fee: --- (Pay at clinic)  Prior Auth: --- PA# Expiration Date:   # of doses approved:   If patient wants fill through the pharmacy benefit please send prescription to: ---, and include estimated need by date in rx notes. Pharmacy will ship medication directly to the office.  Patient NOT eligible for Prolia  Copay Card. Copay Card can make patient's cost as little as $25. Link to apply: [email or web address]://www.[email or web address]  ** This summary of benefits is an estimation of the patient's out-of-pocket cost. Exact cost may very based on individual plan coverage.

## 2023-07-22 DIAGNOSIS — D2271 Melanocytic nevi of right lower limb, including hip: Secondary | ICD-10-CM | POA: Diagnosis not present

## 2023-07-22 DIAGNOSIS — Z85828 Personal history of other malignant neoplasm of skin: Secondary | ICD-10-CM | POA: Diagnosis not present

## 2023-07-22 DIAGNOSIS — Z08 Encounter for follow-up examination after completed treatment for malignant neoplasm: Secondary | ICD-10-CM | POA: Diagnosis not present

## 2023-07-22 DIAGNOSIS — D2272 Melanocytic nevi of left lower limb, including hip: Secondary | ICD-10-CM | POA: Diagnosis not present

## 2023-07-22 DIAGNOSIS — L821 Other seborrheic keratosis: Secondary | ICD-10-CM | POA: Diagnosis not present

## 2023-07-22 DIAGNOSIS — R202 Paresthesia of skin: Secondary | ICD-10-CM | POA: Diagnosis not present

## 2023-07-22 DIAGNOSIS — D225 Melanocytic nevi of trunk: Secondary | ICD-10-CM | POA: Diagnosis not present

## 2023-07-22 DIAGNOSIS — D2261 Melanocytic nevi of right upper limb, including shoulder: Secondary | ICD-10-CM | POA: Diagnosis not present

## 2023-07-22 DIAGNOSIS — D2262 Melanocytic nevi of left upper limb, including shoulder: Secondary | ICD-10-CM | POA: Diagnosis not present

## 2023-07-24 NOTE — Telephone Encounter (Signed)
 Pt notified of cost & process. 1st injection scheduled on 07/31/23

## 2023-07-31 ENCOUNTER — Ambulatory Visit: Payer: MEDICARE

## 2023-07-31 VITALS — BP 129/72 | HR 73

## 2023-07-31 DIAGNOSIS — M810 Age-related osteoporosis without current pathological fracture: Secondary | ICD-10-CM | POA: Diagnosis not present

## 2023-07-31 MED ORDER — DENOSUMAB 60 MG/ML ~~LOC~~ SOSY: 60.0000 mg | PREFILLED_SYRINGE | Freq: Once | SUBCUTANEOUS | Status: DC

## 2023-07-31 MED ORDER — DENOSUMAB 60 MG/ML ~~LOC~~ SOSY
60.0000 mg | PREFILLED_SYRINGE | SUBCUTANEOUS | Status: DC
  Administered 2023-07-31: 60 mg via SUBCUTANEOUS

## 2023-07-31 NOTE — Progress Notes (Signed)
 After obtaining consent, and per orders of Dr.Tullo, MD for 1st dose injection of Prolia  given by [facility] in [place], CMA. Patient instructed to remain in clinic for 20 minutes afterwards, and to report any adverse reaction to me immediately. Patient tolerated injection well.

## 2023-09-11 ENCOUNTER — Telehealth: Payer: Self-pay

## 2023-09-11 DIAGNOSIS — Z23 Encounter for immunization: Secondary | ICD-10-CM

## 2023-09-11 MED ORDER — COVID-19 MRNA VACC (MODERNA) 50 MCG/0.5ML IM SUSP: 0.5000 mL | Freq: Once | INTRAMUSCULAR | 0 refills | Status: AC

## 2023-09-11 NOTE — Telephone Encounter (Signed)
 Copied from CRM [id number]. Topic: Clinical - Medication Question >> Sep 11, 2023  9:15 AM Burnard DEL wrote: Reason for CRM: Patient will be establishing care with Dr Abbey In [place] she would like to know if she could receive a prescription for the covid vaccine?   [id number] [place], [place] - [address]  Phone: [phone] Fax: [phone]

## 2023-09-11 NOTE — Telephone Encounter (Signed)
 Sending to doc of day for review. Pt is scheduled to see Dr. Abbey 01/21/24

## 2023-09-11 NOTE — Telephone Encounter (Signed)
 Vaccine order placed. Lvm notify pt. Okay to relay if pt calls back

## 2023-09-11 NOTE — Addendum Note (Signed)
 Addended by: Otto Felkins on: 09/11/2023 04:24 PM   Modules accepted: Orders

## 2023-09-15 ENCOUNTER — Other Ambulatory Visit: Payer: Self-pay | Admitting: Nurse Practitioner

## 2023-09-15 ENCOUNTER — Telehealth: Payer: Self-pay

## 2023-09-15 DIAGNOSIS — Z23 Encounter for immunization: Secondary | ICD-10-CM

## 2023-09-15 MED ORDER — COVID-19 MRNA VAC-TRIS(PFIZER) 30 MCG/0.3ML IM SUSY: 0.3000 mL | PREFILLED_SYRINGE | Freq: Once | INTRAMUSCULAR | 0 refills | Status: AC

## 2023-09-15 NOTE — Telephone Encounter (Signed)
 Rx came addressed to you wanting an Rx for Pfizer Covid 19 script to be sent in pharmacies need scripts in order for pt to receive vaccine

## 2023-09-28 DIAGNOSIS — H90A22 Sensorineural hearing loss, unilateral, left ear, with restricted hearing on the contralateral side: Secondary | ICD-10-CM | POA: Diagnosis not present

## 2023-09-28 DIAGNOSIS — H6121 Impacted cerumen, right ear: Secondary | ICD-10-CM | POA: Diagnosis not present

## 2023-09-28 DIAGNOSIS — H8112 Benign paroxysmal vertigo, left ear: Secondary | ICD-10-CM | POA: Diagnosis not present

## 2023-10-12 DIAGNOSIS — H8112 Benign paroxysmal vertigo, left ear: Secondary | ICD-10-CM | POA: Diagnosis not present

## 2023-10-12 DIAGNOSIS — Z23 Encounter for immunization: Secondary | ICD-10-CM | POA: Diagnosis not present

## 2023-10-21 DIAGNOSIS — Z23 Encounter for immunization: Secondary | ICD-10-CM | POA: Diagnosis not present

## 2024-01-12 ENCOUNTER — Telehealth: Payer: Self-pay

## 2024-01-12 NOTE — Telephone Encounter (Signed)
 Prolia  VOB initiated via [email or web address]  Next Prolia  inj DUE: 01/31/24

## 2024-01-13 NOTE — Telephone Encounter (Signed)
 Pt ready for scheduling for PROLIA  on or after : 01/31/24  Option# 1: Buy/Bill (Office supplied medication)  Out-of-pocket cost due at time of clinic visit: $0  Number of injection/visits approved: ---  Primary: MEDICARE Prolia  co-insurance: 0% Admin fee co-insurance: 0%  Secondary: UHC-MEDSUP Prolia  co-insurance: covers the Medicare Part B deductible, co-insurance and 100% of the excess charges Admin fee co-insurance:   Medical Benefit Details: Date Benefits were checked: 01/12/24 Deductible: $0 Met of $283 Required/ Coinsurance: 0%/ Admin Fee: 0%  Prior Auth: N/A PA# Expiration Date:   # of doses approved: ----------------------------------------------------------------------- Option# 2- Med Obtained from pharmacy:  Pharmacy benefit: Copay $--- (Paid to pharmacy) Admin Fee: --- (Pay at clinic)  Prior Auth: --- PA# Expiration Date:   # of doses approved:   If patient wants fill through the pharmacy benefit please send prescription to: ---, and include estimated need by date in rx notes. Pharmacy will ship medication directly to the office.  Patient NOT eligible for Prolia  Copay Card. Copay Card can make patient's cost as little as $25. Link to apply: https://www.[email or web address]  ** This summary of benefits is an estimation of the patient's out-of-pocket cost. Exact cost may very based on individual plan coverage.

## 2024-01-13 NOTE — Telephone Encounter (Signed)
 SABRA

## 2024-01-14 NOTE — Telephone Encounter (Signed)
 Left voicemail for pt to return call to schedule Prolia .  Pt ready for scheduling for PROLIA  on or after : 01/31/24   Option# 1: Buy/Bill (Office supplied medication)   Out-of-pocket cost due at time of clinic visit: $0

## 2024-01-14 NOTE — Telephone Encounter (Unsigned)
 Copied from CRM #[id number]. Topic: General - Other >> Jan 14, 2024  1:25 PM Deleta S wrote: Reason for CRM: patient is calling Monica Salazar back regarding injections patient states she does not receive those injections and will not be getting those

## 2024-01-15 NOTE — Telephone Encounter (Addendum)
 I spoke to patient & she no longer wants to continue Prolia . She does not like the way it made her feel.  PA Team: Please archive records in [employer]. Thanks

## 2024-01-21 ENCOUNTER — Ambulatory Visit (INDEPENDENT_AMBULATORY_CARE_PROVIDER_SITE_OTHER): Payer: MEDICARE

## 2024-01-21 VITALS — BP 102/70 | HR 68 | Temp 98.5°F | Ht 61.0 in | Wt 135.0 lb

## 2024-01-21 DIAGNOSIS — R7309 Other abnormal glucose: Secondary | ICD-10-CM | POA: Diagnosis not present

## 2024-01-21 DIAGNOSIS — E782 Mixed hyperlipidemia: Secondary | ICD-10-CM

## 2024-01-21 DIAGNOSIS — I10 Essential (primary) hypertension: Secondary | ICD-10-CM | POA: Diagnosis not present

## 2024-01-21 DIAGNOSIS — M810 Age-related osteoporosis without current pathological fracture: Secondary | ICD-10-CM

## 2024-01-21 DIAGNOSIS — Z1211 Encounter for screening for malignant neoplasm of colon: Secondary | ICD-10-CM | POA: Diagnosis not present

## 2024-01-21 DIAGNOSIS — E0849 Diabetes mellitus due to underlying condition with other diabetic neurological complication: Secondary | ICD-10-CM | POA: Insufficient documentation

## 2024-01-21 MED ORDER — AMLODIPINE BESYLATE 2.5 MG PO TABS: 2.5000 mg | ORAL_TABLET | Freq: Every day | ORAL | 3 refills | Status: AC

## 2024-01-21 NOTE — Assessment & Plan Note (Addendum)
 Blood pressure within goal.  Continue amlodipine  2.5 mg once a day.  Check CMP, future lab order. Orders:   amLODipine  (NORVASC ) 2.5 MG tablet; Take 1 tablet (2.5 mg total) by mouth daily.   Comprehensive metabolic panel; Future

## 2024-01-21 NOTE — Assessment & Plan Note (Addendum)
 The 10-year ASCVD risk score (Arnett DK, et al., 2019) is: 11.4% Previous intolerance to atorvastatin .  Discussed getting fasting lipid panel.   If ASCVD risk more than 10% recommend potential use of rosuvastatin with monitoring for side effects. Emphasized cholesterol management to reduce cardiovascular risk. Ordered fasting lipid panel. Will consider rosuvastatin 5 mg three times a week if LDL is elevated. Monitor for muscle pain, flu-like symptoms, or dark urine. Orders:   Lipid panel; Future

## 2024-01-21 NOTE — Progress Notes (Signed)
 "  Established Patient Office Visit   Subjective  Patient ID: Monica Salazar, female    DOB: 11-28-1950  Age: 74 y.o. MRN: [id number]  Chief Complaint  Patient presents with   Medication Management   Establish Care    Discussed the use of AI scribe software for clinical note transcription with the patient, who gave verbal consent to proceed.  History of Present Illness Monica Salazar is a 74 year old female who presents to establish care with new PCP and chronic medication management.    Osteoporosis:  he has a history of osteoporosis and previously received a Prolia  injection in July 2025, which caused significant achiness, leading her to discontinue it.  She reports trying a different injection, oral medication about 10 years ago for osteoporosis which contributed to her jaw pain leading to discontinuation of medication.  Her most recent DEXA scan from June of the previous year showed a T-score of -3.1, with a change of nearly ten percent from the prior scan. She currently manages her osteoporosis with dietary calcium  and vitamin D  supplementation, although she has not been taking vitamin D  recently as her previous prescription ran out.   Hypertension: She has hypertension and is currently taking amlodipine  2.5 mg at night around 9 PM. Her blood pressure has been well-controlled on this medication, and she does not regularly check her blood pressure at home.  Mixed hyperlipidemia with history of muscle ache on atorvastatin : She has a history of high cholesterol and previously tried atorvastatin , which caused thigh pain, leading her to discontinue it. She is currently taking a supplement to manage her cholesterol.  Vertigo History of sudden loss of hearing on left side Wearing hearing aids She experienced sudden hearing loss in her left ear nine years ago while riding in a car.  She now uses a [facility] hearing aid system to assist with hearing.  Follows up with [facility].  She reports a  history of vertigo, which she attributes to her ear condition. She experiences vertigo about once a year and manages it with exercises and by visiting her ENT for an Epley maneuver when needed.  She maintains an active lifestyle, walking three to four miles daily, performing stretching exercises, and engaging in light weightlifting. She does not consume alcohol and is up to date on her vaccinations, including flu, COVID, and RSV.  She is due for colorectal cancer screening.  Previously normal Cologuard x 2.  No family history of colorectal cancer.   ROS As per HPI    Objective:     BP 102/70 (BP Location: Right Arm, Patient Position: Sitting)   Pulse 68   Temp 98.5 F (36.9 C) (Oral)   Ht 5' 1 (1.549 m)   Wt 135 lb (61.2 kg)   SpO2 95%   BMI 25.51 kg/m      01/21/2024   11:13 AM 05/12/2023    8:49 AM 10/15/2022    2:38 PM  Depression screen PHQ 2/9  Decreased Interest 0 0 0  Down, Depressed, Hopeless 0 0 0  PHQ - 2 Score 0 0 0  Altered sleeping 0 0 0  Tired, decreased energy 0 0 0  Change in appetite 0 0 0  Feeling bad or failure about yourself  0 0 0  Trouble concentrating 0 0 0  Moving slowly or fidgety/restless 0 0 0  Suicidal thoughts 0 0 0  PHQ-9 Score 0 0  0   Difficult doing work/chores Not difficult at all Not difficult at  all Not difficult at all     Data saved with a previous flowsheet row definition      01/21/2024   11:13 AM 05/12/2023    8:49 AM 07/02/2022    9:56 AM 05/02/2022    8:23 AM  GAD 7 : Generalized Anxiety Score  Nervous, Anxious, on Edge 0 0 0 0  Control/stop worrying 0 0 0 0  Worry too much - different things 0 0 0 0  Trouble relaxing 0 0 0 0  Restless 0 0 0 0  Easily annoyed or irritable 0 0 0 0  Afraid - awful might happen 0 0 0 0  Total GAD 7 Score 0 0 0 0  Anxiety Difficulty Not difficult at all Not difficult at all Not difficult at all       01/21/2024   11:13 AM 05/12/2023    8:49 AM 10/15/2022    2:38 PM  Depression screen PHQ 2/9   Decreased Interest 0 0 0  Down, Depressed, Hopeless 0 0 0  PHQ - 2 Score 0 0 0  Altered sleeping 0 0 0  Tired, decreased energy 0 0 0  Change in appetite 0 0 0  Feeling bad or failure about yourself  0 0 0  Trouble concentrating 0 0 0  Moving slowly or fidgety/restless 0 0 0  Suicidal thoughts 0 0 0  PHQ-9 Score 0 0  0   Difficult doing work/chores Not difficult at all Not difficult at all Not difficult at all     Data saved with a previous flowsheet row definition      01/21/2024   11:13 AM 05/12/2023    8:49 AM 07/02/2022    9:56 AM 05/02/2022    8:23 AM  GAD 7 : Generalized Anxiety Score  Nervous, Anxious, on Edge 0 0 0 0  Control/stop worrying 0 0 0 0  Worry too much - different things 0 0 0 0  Trouble relaxing 0 0 0 0  Restless 0 0 0 0  Easily annoyed or irritable 0 0 0 0  Afraid - awful might happen 0 0 0 0  Total GAD 7 Score 0 0 0 0  Anxiety Difficulty Not difficult at all Not difficult at all Not difficult at all    SDOH Screenings   Food Insecurity: No Food Insecurity (10/16/2023)  Housing: Unknown (10/16/2023)  Transportation Needs: No Transportation Needs (10/16/2023)  Utilities: Not At Risk (10/15/2022)  Alcohol Screen: Low Risk (10/15/2022)  Depression (PHQ2-9): Low Risk (01/21/2024)  Financial Resource Strain: Low Risk (10/16/2023)  Physical Activity: Sufficiently Active (10/16/2023)  Social Connections: Socially Integrated (10/16/2023)  Stress: No Stress Concern Present (10/16/2023)  Tobacco Use: Low Risk (01/21/2024)  Health Literacy: Adequate Health Literacy (10/15/2022)     Physical Exam Constitutional:      Appearance: Normal appearance.  HENT:     Head: Normocephalic and atraumatic.     Comments: Wearing hearing aids.    Right Ear: There is no impacted cerumen.     Left Ear: There is no impacted cerumen.     Mouth/Throat:     Mouth: Mucous membranes are moist.  Neck:     Thyroid : No thyroid  mass or thyroid  tenderness.  Cardiovascular:     Rate  and Rhythm: Normal rate and regular rhythm.  Pulmonary:     Effort: Pulmonary effort is normal.     Breath sounds: Normal breath sounds. No wheezing.  Abdominal:     General: Bowel sounds are normal.  Palpations: Abdomen is soft.     Tenderness: There is no guarding.  Musculoskeletal:     Cervical back: Neck supple. No rigidity.     Right lower leg: No edema.     Left lower leg: No edema.  Lymphadenopathy:     Cervical: No cervical adenopathy.  Skin:    General: Skin is warm.  Neurological:     Mental Status: She is alert and oriented to person, place, and time.     Gait: Gait normal.  Psychiatric:        Mood and Affect: Mood normal.        Behavior: Behavior normal.        No results found for any visits on 01/21/24.  The 10-year ASCVD risk score (Arnett DK, et al., 2019) is: 11.4%     Assessment & Plan:   Assessment & Plan Primary hypertension Blood pressure within goal.  Continue amlodipine  2.5 mg once a day.  Check CMP, future lab order. Orders:   amLODipine  (NORVASC ) 2.5 MG tablet; Take 1 tablet (2.5 mg total) by mouth daily.   Comprehensive metabolic panel; Future  Mixed hyperlipidemia The 10-year ASCVD risk score (Arnett DK, et al., 2019) is: 11.4% Previous intolerance to atorvastatin .  Discussed getting fasting lipid panel.   If ASCVD risk more than 10% recommend potential use of rosuvastatin with monitoring for side effects. Emphasized cholesterol management to reduce cardiovascular risk. Ordered fasting lipid panel. Will consider rosuvastatin 5 mg three times a week if LDL is elevated. Monitor for muscle pain, flu-like symptoms, or dark urine. Orders:   Lipid panel; Future  Age-related osteoporosis without current pathological fracture Osteoporosis with T-score of -3.1 and 10% decrease in bone density. Previous adverse reactions to osteoporosis medications including Prolia , oral bisphosphonate( about 10 years ago).  Referred to endocrinologist for  osteoporosis management. Check CMP, vitamin D  level. Recommended calcium  citrate 600 mg twice daily. Recommended vitamin D  1000 IU daily. Encouraged continuation of weight-bearing exercises and active lifestyle. Orders:   Ambulatory referral to Endocrinology   Vitamin D  (25 hydroxy); Future  Colon cancer screening Ordered Cologuard for colon cancer screening.  Patient encouraged to diagnostic colonoscopy if Cologuard comes back positive.  Previous Cologuard has been normal and there is no significant family history for colorectal cancer. Orders:   Cologuard  Blood glucose abnormal History of mildly elevated blood glucose, check A1c. Orders:   Hemoglobin A1c; Future   I personally spent a total of 40 minutes in the care of the patient today including preparing to see the patient, getting/reviewing separately obtained history, performing a medically appropriate exam/evaluation, counseling and educating, placing orders, referring and communicating with other health care professionals, documenting clinical information in the EHR, independently interpreting results, and communicating results.  Return for Fasting labs at patient's convinience, f/u with Dr. Abbey in 6 Months.   Luke Abbey, MD "

## 2024-01-21 NOTE — Patient Instructions (Addendum)
 Take calcium  citrate 600 mg twice a day.  Take over the counter vitamin D  1000  international units daily that you can get it over the counter.  I am referring you to an endocrinologist to look into treatment for osteoporosis. If you do not hear from them in couple of weeks to schedule an appointment please reach out to our office.   Recommend fasting labs. I will reach out to you with lab results and recommendations.

## 2024-01-21 NOTE — Assessment & Plan Note (Addendum)
 Ordered Cologuard for colon cancer screening.  Patient encouraged to diagnostic colonoscopy if Cologuard comes back positive.  Previous Cologuard has been normal and there is no significant family history for colorectal cancer. Orders:   Cologuard

## 2024-01-21 NOTE — Assessment & Plan Note (Addendum)
 Osteoporosis with T-score of -3.1 and 10% decrease in bone density. Previous adverse reactions to osteoporosis medications including Prolia , oral bisphosphonate( about 10 years ago).  Referred to endocrinologist for osteoporosis management. Check CMP, vitamin D  level. Recommended calcium  citrate 600 mg twice daily. Recommended vitamin D  1000 IU daily. Encouraged continuation of weight-bearing exercises and active lifestyle. Orders:   Ambulatory referral to Endocrinology   Vitamin D  (25 hydroxy); Future

## 2024-01-21 NOTE — Assessment & Plan Note (Deleted)
 SABRA

## 2024-01-21 NOTE — Assessment & Plan Note (Deleted)
 Monica Salazar

## 2024-01-29 ENCOUNTER — Other Ambulatory Visit (INDEPENDENT_AMBULATORY_CARE_PROVIDER_SITE_OTHER): Payer: MEDICARE

## 2024-01-29 DIAGNOSIS — I10 Essential (primary) hypertension: Secondary | ICD-10-CM

## 2024-01-29 DIAGNOSIS — R7309 Other abnormal glucose: Secondary | ICD-10-CM

## 2024-01-29 DIAGNOSIS — M810 Age-related osteoporosis without current pathological fracture: Secondary | ICD-10-CM | POA: Diagnosis not present

## 2024-01-29 DIAGNOSIS — E782 Mixed hyperlipidemia: Secondary | ICD-10-CM

## 2024-01-29 LAB — COMPREHENSIVE METABOLIC PANEL WITH GFR
ALT: 12 U/L (ref 3–35)
AST: 19 U/L (ref 5–37)
Albumin: 4.3 g/dL (ref 3.5–5.2)
Alkaline Phosphatase: 40 U/L (ref 39–117)
BUN: 11 mg/dL (ref 6–23)
CO2: 29 meq/L (ref 19–32)
Calcium: 9.1 mg/dL (ref 8.4–10.5)
Chloride: 107 meq/L (ref 96–112)
Creatinine, Ser: 0.59 mg/dL (ref 0.40–1.20)
GFR: 89.66 mL/min
Glucose, Bld: 96 mg/dL (ref 70–99)
Potassium: 4.5 meq/L (ref 3.5–5.1)
Sodium: 142 meq/L (ref 135–145)
Total Bilirubin: 0.8 mg/dL (ref 0.2–1.2)
Total Protein: 6.6 g/dL (ref 6.0–8.3)

## 2024-01-29 LAB — LIPID PANEL
Cholesterol: 239 mg/dL — ABNORMAL HIGH (ref 28–200)
HDL: 64.1 mg/dL
LDL Cholesterol: 153 mg/dL — ABNORMAL HIGH (ref 10–99)
NonHDL: 175.23
Total CHOL/HDL Ratio: 4
Triglycerides: 109 mg/dL (ref 10.0–149.0)
VLDL: 21.8 mg/dL (ref 0.0–40.0)

## 2024-01-29 LAB — HEMOGLOBIN A1C: Hgb A1c MFr Bld: 5.8 % (ref 4.6–6.5)

## 2024-01-29 LAB — VITAMIN D 25 HYDROXY (VIT D DEFICIENCY, FRACTURES): VITD: 41.08 ng/mL (ref 30.00–100.00)

## 2024-02-01 ENCOUNTER — Ambulatory Visit: Payer: Self-pay

## 2024-02-01 ENCOUNTER — Ambulatory Visit: Payer: MEDICARE

## 2024-02-01 DIAGNOSIS — E782 Mixed hyperlipidemia: Secondary | ICD-10-CM

## 2024-02-01 NOTE — Progress Notes (Signed)
 The 10-year ASCVD risk score (Arnett DK, et al., 2019) is: 11.5%

## 2024-02-02 ENCOUNTER — Ambulatory Visit: Payer: MEDICARE

## 2024-02-02 MED ORDER — ROSUVASTATIN CALCIUM 5 MG PO TABS: 5.0000 mg | ORAL_TABLET | ORAL | 1 refills | Status: AC

## 2024-02-02 NOTE — Telephone Encounter (Signed)
 1. Mixed hyperlipidemia (Primary) -Patient reached out via MyChart agreeing on starting rosuvastatin  5 mg 3 times a week.  Prescription sent to the pharmacy.  Recommend repeating lipid panel, hepatic function prior to her next visit in 3 months.  - rosuvastatin  (CRESTOR ) 5 MG tablet; Take 1 tablet (5 mg total) by mouth 3 (three) times a week.  Dispense: 45 tablet; Refill: 1 - Lipid panel; Future - Hepatic function panel; Future  Luke Shade, MD

## 2024-02-11 LAB — COLOGUARD: COLOGUARD: NEGATIVE

## 2024-02-12 ENCOUNTER — Inpatient Hospital Stay: Admission: RE | Admit: 2024-02-12 | Discharge: 2024-02-12 | Payer: MEDICARE

## 2024-02-12 VITALS — BP 130/80 | HR 74 | Temp 98.1°F | Resp 18 | Wt 130.0 lb

## 2024-02-12 DIAGNOSIS — J0100 Acute maxillary sinusitis, unspecified: Secondary | ICD-10-CM

## 2024-02-12 MED ORDER — AMOXICILLIN-POT CLAVULANATE 875-125 MG PO TABS: 1.0000 | ORAL_TABLET | Freq: Two times a day (BID) | ORAL | 0 refills | Status: AC

## 2024-02-12 NOTE — ED Provider Notes (Signed)
 " [name]    CSN: [id number] Arrival date & time: 02/12/24  1505      History   Chief Complaint Chief Complaint  Patient presents with   Cough    HPI Monica Salazar is a 74 y.o. female.  Patient presents with 2-week history of postnasal drainage, congestion, sneezing, cough.  No fever, shortness of breath, vomiting, diarrhea.  Patient has been treating her symptoms with DayQuil and NyQuil.  The history is provided by the patient and medical records.    Past Medical History:  Diagnosis Date   Arthritis    hands   COVID-19    09/2021   Hearing loss    left ear 09/27/16 MR, CT negative etiology she had rash neck 2 days before hearing loss left ear so ? virus   History of chicken pox    Migraine    tries Excedrine migraine-normally sees swigly lines/distorted vision unable to talk, left arm recently numb; senstive light and sound   Osteoporosis    UTI (urinary tract infection)    Vertigo    Vitamin D  deficiency     Patient Active Problem List   Diagnosis Date Noted   Colon cancer screening 01/21/2024   Annual physical exam 05/12/2023   Nonspecific syndrome suggestive of viral illness 08/07/2022   Breast cancer screening by mammogram 07/02/2022   Postmenopausal estrogen deficiency 07/02/2022   BPPV (benign paroxysmal positional vertigo) 05/14/2022   Screening mammogram, encounter for 04/13/2022   Primary hypertension 04/10/2022   Cough 04/09/2022   Hip pain, chronic, right 06/14/2020   Cervicalgia 06/14/2020   Decreased peripheral vision 05/24/2019   Arthritis of right hand 05/24/2019   Bursitis of hip 05/24/2019   Vertigo 03/16/2019   B12 deficiency 11/23/2018   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   Osteoporosis 11/23/2018   Hyperlipidemia 11/23/2018   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/2018   Sensorineural hearing loss (SNHL) of left ear with unrestricted hearing of right ear 03/25/2018   History of migraine  10/21/2017   Arthritis 10/21/2017   SK (seborrheic keratosis) 10/21/2017   Vitamin D  deficiency 10/21/2017   History of osteoporosis 10/21/2017   Sensorineural hearing loss (SNHL) of left ear 10/21/2017   Labyrinthitis 12/25/2006   Syncope and collapse 09/21/2006   Physiologic disturbance of temperature regulation 11/18/2004   Ganglion cyst 09/12/2003   Generalized osteoarthrosis, 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      Home Medications    Prior to Admission medications  Medication Sig Start Date End Date Taking? Authorizing Provider  amoxicillin -clavulanate (AUGMENTIN ) 875-125 MG tablet Take 1 tablet by mouth every 12 (twelve) hours. 02/12/24  Yes Corlis Burnard DEL, NP  amLODipine  (NORVASC ) 2.5 MG tablet Take 1 tablet (2.5 mg total) by mouth daily. 01/21/24   Bair, Kalpana, MD  Multiple Vitamins-Minerals (CENTRUM SILVER ULTRA WOMENS) TABS Take by mouth daily.    [provider]  rosuvastatin  (CRESTOR ) 5 MG tablet Take 1 tablet (5 mg total) by mouth 3 (three) times a week. 02/03/24   Abbey Bruckner, MD    Family History Family History  Problem Relation Age of Onset   Lung cancer  Mother    Early death Mother    Cancer Mother        lung cancer smoker    Heart attack Father    Stroke Father    Heart disease Father    Migraines Daughter    Diabetes Maternal Grandmother        died comp. DM 2    Early death Maternal Grandmother    Hypertension Maternal Grandmother    Early death Paternal Grandfather    Hearing loss Paternal Grandfather    Arthritis Brother        rheumatoid   Migraines Brother    Breast cancer Neg Hx     Social History Social History[1]   Allergies   Boniva [ibandronate]   Review of  Systems Review of Systems  Constitutional:  Negative for chills and fever.  HENT:  Positive for congestion, postnasal drip, rhinorrhea and sneezing. Negative for ear pain and sore throat.   Respiratory:  Positive for cough. Negative for shortness of breath.   Cardiovascular:  Negative for chest pain and palpitations.  Gastrointestinal:  Negative for diarrhea and vomiting.     Physical Exam Triage Vital Signs ED Triage Vitals [02/12/24 1520]  Encounter Vitals Group     BP 130/80     Girls Systolic BP Percentile      Girls Diastolic BP Percentile      Boys Systolic BP Percentile      Boys Diastolic BP Percentile      Pulse Rate 74     Resp 18     Temp 98.1 F (36.7 C)     Temp src      SpO2 95 %     Weight      Height      Head Circumference      Peak Flow      Pain Score      Pain Loc      Pain Education      Exclude from Growth Chart    No data found.  Updated Vital Signs BP 130/80   Pulse 74   Temp 98.1 F (36.7 C)   Resp 18   Wt 130 lb (59 kg)   SpO2 95%   BMI 24.56 kg/m   Visual Acuity Right Eye Distance:   Left Eye Distance:   Bilateral Distance:    Right Eye Near:   Left Eye Near:    Bilateral Near:     Physical Exam Constitutional:      General: She is not in acute distress. HENT:     Right Ear: Tympanic membrane normal.     Left Ear: Tympanic membrane normal.     Nose: Congestion and rhinorrhea present.     Mouth/Throat:     Mouth: Mucous membranes are moist.     Pharynx: Oropharynx is clear.  Cardiovascular:     Rate and Rhythm: Normal rate and regular rhythm.     Heart sounds: Normal heart sounds.  Pulmonary:     Effort: Pulmonary effort is normal. No respiratory distress.     Breath sounds: Normal breath sounds.  Neurological:     Mental Status: She is alert.      UC Treatments / Results  Labs (all labs ordered are listed, but only abnormal results are displayed) Labs Reviewed - No data to display  EKG   Radiology No  results found.  Procedures Procedures (including critical care time)  Medications Ordered in UC Medications - No data to display  Initial Impression / Assessment and Plan / UC Course  I have reviewed the triage vital signs and the nursing notes.  Pertinent labs & imaging results that were available during my care of the patient were reviewed by me and considered in my medical decision making (see chart for details).    Acute sinusitis.  Afebrile and vital signs are stable.  Lungs are clear at this time and O2 sat is 95% on room air.  Patient has been symptomatic for 2 weeks and is not improving with OTC treatment.  Treating today with Augmentin .  Education provided on sinus infection.  Instructed patient to follow-up with her PCP if she is not improving.  She agrees to plan of care.  Final Clinical Impressions(s) / UC Diagnoses   Final diagnoses:  Acute non-recurrent maxillary sinusitis     Discharge Instructions      Take the Augmentin  as directed for your sinus infection.  Follow-up with your primary care provider if your symptoms are not improving.      ED Prescriptions     Medication Sig Dispense Auth. Provider   amoxicillin -clavulanate (AUGMENTIN ) 875-125 MG tablet Take 1 tablet by mouth every 12 (twelve) hours. 14 tablet Corlis Burnard DEL, NP      PDMP not reviewed this encounter.    [1]  Social History Tobacco Use   Smoking status: Never   Smokeless tobacco: Never  Vaping Use   Vaping status: Never Used  Substance Use Topics   Alcohol use: Never   Drug use: Never     Corlis Burnard DEL, NP 02/12/24 1604  "

## 2024-02-12 NOTE — Discharge Instructions (Addendum)
 Take the Augmentin  as directed for your sinus infection.  Follow-up with your primary care provider if your symptoms are not improving.

## 2024-02-12 NOTE — ED Triage Notes (Signed)
 Here today for cough, sneezing (gone away a few days ago), phlegm, for 14 days. Tried Fisherman's Friend, Nyquil, and Dayquil, and saline solution has not helped.

## 2024-05-02 ENCOUNTER — Other Ambulatory Visit: Payer: MEDICARE

## 2024-05-05 ENCOUNTER — Ambulatory Visit: Payer: MEDICARE

## 2024-07-20 ENCOUNTER — Ambulatory Visit: Payer: MEDICARE
